# Patient Record
Sex: Female | Born: 1940 | Race: White | Hispanic: No | Marital: Married | Smoking: Never smoker
Health system: Southern US, Community
[De-identification: ages and names within clinical notes are randomized; demographics above are authoritative.]

## PROBLEM LIST (undated history)

## (undated) DIAGNOSIS — K219 Gastro-esophageal reflux disease without esophagitis: Secondary | ICD-10-CM

## (undated) DIAGNOSIS — M1990 Unspecified osteoarthritis, unspecified site: Secondary | ICD-10-CM

## (undated) DIAGNOSIS — IMO0002 Reserved for concepts with insufficient information to code with codable children: Secondary | ICD-10-CM

## (undated) DIAGNOSIS — I10 Essential (primary) hypertension: Secondary | ICD-10-CM

## (undated) HISTORY — PX: TONSILLECTOMY: SUR1361

## (undated) HISTORY — PX: APPENDECTOMY: SHX54

## (undated) HISTORY — PX: OTHER SURGICAL HISTORY: SHX169

## (undated) HISTORY — PX: ANKLE SURGERY: SHX546

---

## 2004-08-30 ENCOUNTER — Ambulatory Visit: Payer: Self-pay | Admitting: Podiatry

## 2008-02-14 ENCOUNTER — Ambulatory Visit: Payer: Self-pay | Admitting: Unknown Physician Specialty

## 2015-09-23 ENCOUNTER — Encounter: Payer: Self-pay | Admitting: *Deleted

## 2015-09-24 ENCOUNTER — Ambulatory Visit
Admission: RE | Admit: 2015-09-24 | Discharge: 2015-09-24 | Disposition: A | Discharge status: Home / Self Care | Payer: Medicare Other | Source: Ambulatory Visit | Attending: Unknown Physician Specialty | Admitting: Unknown Physician Specialty

## 2015-09-24 ENCOUNTER — Ambulatory Visit: Payer: Medicare Other | Admitting: Anesthesiology

## 2015-09-24 ENCOUNTER — Encounter
Admission: RE | Disposition: A | Discharge status: Home / Self Care | Payer: Self-pay | Source: Ambulatory Visit | Attending: Unknown Physician Specialty

## 2015-09-24 ENCOUNTER — Encounter: Payer: Self-pay | Admitting: Anesthesiology

## 2015-09-24 DIAGNOSIS — M1990 Unspecified osteoarthritis, unspecified site: Secondary | ICD-10-CM | POA: Diagnosis not present

## 2015-09-24 DIAGNOSIS — Z1211 Encounter for screening for malignant neoplasm of colon: Secondary | ICD-10-CM | POA: Insufficient documentation

## 2015-09-24 DIAGNOSIS — I10 Essential (primary) hypertension: Secondary | ICD-10-CM | POA: Insufficient documentation

## 2015-09-24 DIAGNOSIS — K635 Polyp of colon: Secondary | ICD-10-CM | POA: Insufficient documentation

## 2015-09-24 DIAGNOSIS — Z79899 Other long term (current) drug therapy: Secondary | ICD-10-CM | POA: Insufficient documentation

## 2015-09-24 DIAGNOSIS — K640 First degree hemorrhoids: Secondary | ICD-10-CM | POA: Insufficient documentation

## 2015-09-24 DIAGNOSIS — Z86010 Personal history of colonic polyps: Secondary | ICD-10-CM | POA: Insufficient documentation

## 2015-09-24 HISTORY — PX: COLONOSCOPY WITH PROPOFOL: SHX5780

## 2015-09-24 HISTORY — DX: Essential (primary) hypertension: I10

## 2015-09-24 HISTORY — DX: Unspecified osteoarthritis, unspecified site: M19.90

## 2015-09-24 SURGERY — COLONOSCOPY WITH PROPOFOL
Anesthesia: General

## 2015-09-24 MED ORDER — SODIUM CHLORIDE 0.9 % IV SOLN
INTRAVENOUS | Status: DC
  Administered 2015-09-24: 1000 mL via INTRAVENOUS

## 2015-09-24 MED ORDER — FENTANYL CITRATE (PF) 100 MCG/2ML IJ SOLN
INTRAMUSCULAR | Status: DC | PRN
  Administered 2015-09-24: 50 ug via INTRAVENOUS

## 2015-09-24 MED ORDER — PROPOFOL 500 MG/50ML IV EMUL
INTRAVENOUS | Status: DC | PRN
  Administered 2015-09-24: 100 ug/kg/min via INTRAVENOUS

## 2015-09-24 MED ORDER — MIDAZOLAM HCL 2 MG/2ML IJ SOLN
INTRAMUSCULAR | Status: DC | PRN
  Administered 2015-09-24: 1 mg via INTRAVENOUS

## 2015-09-24 MED ORDER — SODIUM CHLORIDE 0.9 % IV SOLN: INTRAVENOUS | Status: DC

## 2015-09-24 NOTE — OR Nursing (Signed)
One small polyp not retrieved

## 2015-09-24 NOTE — H&P (Signed)
   Primary Care Physician:  Rafael BihariWALKER III, JOHN B, MD Primary Gastroenterologist:  Dr. Mechele CollinElliott  Pre-Procedure History & Physical: HPI:  Cindy Greene is a 75 y.o. female is here for an colonoscopy.   Past Medical History:  Diagnosis Date  . Arthritis    osteoarthritis  . Hypertension     Past Surgical History:  Procedure Laterality Date  . ANKLE SURGERY    . APPENDECTOMY    . excision ganglion cyst wrist    . left foot reconstrucion    . right foot reconstruction    . TONSILLECTOMY      Prior to Admission medications   Medication Sig Start Date End Date Taking? Authorizing Provider  Calcium Carb-Cholecalciferol (CALCIUM CARBONATE-VITAMIN D3 PO) Take by mouth.   Yes Historical Provider, MD  Cholecalciferol (VITAMIN D3) 1000 units CAPS Take by mouth.   Yes Historical Provider, MD  estradiol (CLIMARA - DOSED IN MG/24 HR) 0.05 mg/24hr patch Place 0.05 mg onto the skin once a week.   Yes Historical Provider, MD  Ginkgo Biloba 40 MG TABS Take by mouth.   Yes Historical Provider, MD  Glucos-MSM-C-Mn-Ginger-Willow (MSM GLUCOSAMINE COMPLEX PO) Take by mouth.   Yes Historical Provider, MD  hydrochlorothiazide (HYDRODIURIL) 25 MG tablet Take 25 mg by mouth daily.   Yes Historical Provider, MD  ibuprofen (ADVIL,MOTRIN) 400 MG tablet Take 400 mg by mouth every 6 (six) hours as needed.   Yes Historical Provider, MD  Omega-3 Fatty Acids (FISH OIL CONCENTRATE PO) Take by mouth.   Yes Historical Provider, MD  progesterone (PROMETRIUM) 200 MG capsule Take 200 mg by mouth daily.   Yes Historical Provider, MD  traMADol (ULTRAM) 50 MG tablet Take by mouth every 6 (six) hours as needed.   Yes Historical Provider, MD    Allergies as of 08/19/2015  . (Not on File)    History reviewed. No pertinent family history.  Social History   Social History  . Marital status: Married    Spouse name: N/A  . Number of children: N/A  . Years of education: N/A   Occupational History  . Not on file.    Social History Main Topics  . Smoking status: Never Smoker  . Smokeless tobacco: Current User  . Alcohol use Not on file  . Drug use: Unknown  . Sexual activity: Not on file   Other Topics Concern  . Not on file   Social History Narrative  . No narrative on file    Review of Systems: See HPI, otherwise negative ROS  Physical Exam: BP (!) 175/84   Pulse 86   Temp 98.9 F (37.2 C) (Tympanic)   Resp 16   Ht 5' (1.524 m)   Wt 74.8 kg (165 lb)   SpO2 95%   BMI 32.22 kg/m  General:   Alert,  pleasant and cooperative in NAD Head:  Normocephalic and atraumatic. Neck:  Supple; no masses or thyromegaly. Lungs:  Clear throughout to auscultation.    Heart:  Regular rate and rhythm. Abdomen:  Soft, nontender and nondistended. Normal bowel sounds, without guarding, and without rebound.   Neurologic:  Alert and  oriented x4;  grossly normal neurologically.  Impression/Plan: Cindy Greene is here for an colonoscopy to [facility]  Risks, benefits, limitations, and alternatives regarding  colonoscopy have been reviewed with the patient.  Questions have been answered.  All parties agreeable.   Lynnae PrudeELLIOTT, ROBERT, MD  09/24/2015, 2:26 PM

## 2015-09-24 NOTE — Anesthesia Procedure Notes (Signed)
Performed by: COOK-MARTIN, Andrian Sabala Pre-anesthesia Checklist: Patient identified, Emergency Drugs available, Suction available, Patient being monitored and Timeout performed Patient Re-evaluated:Patient Re-evaluated prior to inductionOxygen Delivery Method: Nasal cannula Preoxygenation: Pre-oxygenation with 100% oxygen Intubation Type: IV induction Placement Confirmation: positive ETCO2 and CO2 detector       

## 2015-09-24 NOTE — Transfer of Care (Signed)
Immediate Anesthesia Transfer of Care Note  Patient: Cindy JewelsBonnie L Navarrete  Procedure(s) Performed: Procedure(s): COLONOSCOPY WITH PROPOFOL (N/A)  Patient Location: PACU  Anesthesia Type:General  Level of Consciousness: awake and sedated  Airway & Oxygen Therapy: Patient Spontanous Breathing and Patient connected to nasal cannula oxygen  Post-op Assessment: Report given to RN and Post -op Vital signs reviewed and stable  Post vital signs: Reviewed and stable  Last Vitals:  Vitals:   09/24/15 1309  BP: (!) 175/84  Pulse: 86  Resp: 16  Temp: 37.2 C    Last Pain:  Vitals:   09/24/15 1309  TempSrc: Tympanic         Complications: No apparent anesthesia complications

## 2015-09-24 NOTE — Anesthesia Preprocedure Evaluation (Signed)
Anesthesia Evaluation  Patient identified by MRN, date of birth, ID band Patient awake    Reviewed: Allergy & Precautions, NPO status , Patient's Chart, lab work & pertinent test results  Airway Mallampati: III       Dental  (+) Teeth Intact   Pulmonary neg pulmonary ROS,    breath sounds clear to auscultation       Cardiovascular Exercise Tolerance: Good hypertension, Pt. on medications  Rhythm:Regular     Neuro/Psych negative neurological ROS     GI/Hepatic negative GI ROS, Neg liver ROS,   Endo/Other  negative endocrine ROS  Renal/GU negative Renal ROS     Musculoskeletal   Abdominal Normal abdominal exam  (+)   Peds negative pediatric ROS (+)  Hematology negative hematology ROS (+)   Anesthesia Other Findings   Reproductive/Obstetrics                             Anesthesia Physical Anesthesia Plan  ASA: II  Anesthesia Plan: General   Post-op Pain Management:    Induction: Intravenous  Airway Management Planned: Natural Airway and Nasal Cannula  Additional Equipment:   Intra-op Plan:   Post-operative Plan:   Informed Consent: I have reviewed the patients History and Physical, chart, labs and discussed the procedure including the risks, benefits and alternatives for the proposed anesthesia with the patient or authorized representative who has indicated his/her understanding and acceptance.     Plan Discussed with: CRNA  Anesthesia Plan Comments:         Anesthesia Quick Evaluation

## 2015-09-24 NOTE — Op Note (Signed)
[facility] Gastroenterology Patient Name: Stanford BreedBonnie Greene Procedure Date: 09/24/2015 2:34 PM MRN: [id number] Account #: [id number] Date of Birth: 10/02/1940 Admit Type: Outpatient Age: [age] [facility] Gender: Female Note Status: Finalized Procedure:            Colonoscopy Indications:          High risk colon cancer surveillance: Personal history                        of colonic polyps Providers:            Scot Junobert T. Jaedan Huttner, MD Referring MD:         Letta PateJohn B. Danne HarborWalker III, MD (Referring MD) Medicines:            Propofol per Anesthesia Complications:        No immediate complications. Procedure:            Pre-Anesthesia Assessment:                       - After reviewing the risks and benefits, the patient                        was deemed in satisfactory condition to undergo the                        procedure.                       After obtaining informed consent, the colonoscope was                        passed under direct vision. Throughout the procedure,                        the patient's blood pressure, pulse, and oxygen                        saturations were monitored continuously. The                        Colonoscope was introduced through the anus and                        advanced to the the cecum, identified by appendiceal                        orifice and ileocecal valve. The colonoscopy was                        performed without difficulty. The patient tolerated the                        procedure well. The quality of the bowel preparation                        was good. Findings:      A diminutive polyp was found in the sigmoid colon. The polyp was       sessile. The polyp was removed with a cold snare. Resection was       complete, but the polyp tissue was not retrieved.  Internal hemorrhoids were found during endoscopy. The hemorrhoids were       small and Grade I (internal hemorrhoids that do not prolapse).  The exam was otherwise without abnormality. Impression:           - One diminutive polyp in the sigmoid colon, removed                        with a cold snare. Complete resection. Polyp tissue not                        retrieved.                       - Internal hemorrhoids.                       - The examination was otherwise normal. Recommendation:       - The findings and recommendations were discussed with                        the patient's family. Repeat in 5 years or not at all. Scot Jun, MD 09/24/2015 2:56:58 PM This report has been signed electronically. Number of Addenda: 0 Note Initiated On: 09/24/2015 2:34 PM Scope Withdrawal Time: 0 hours 10 minutes 36 seconds  Total Procedure Duration: 0 hours 16 minutes 19 seconds       [facility]

## 2015-09-25 NOTE — Anesthesia Postprocedure Evaluation (Signed)
Anesthesia Post Note  Patient: Cindy Greene  Procedure(s) Performed: Procedure(s) (LRB): COLONOSCOPY WITH PROPOFOL (N/A)  Patient location during evaluation: PACU Anesthesia Type: General Level of consciousness: awake Pain management: pain level controlled Respiratory status: spontaneous breathing Cardiovascular status: stable Anesthetic complications: no    Last Vitals:  Vitals:   09/24/15 1517 09/24/15 1527  BP: (!) 148/72 (!) 160/69  Pulse: 66 63  Resp: 16 14  Temp:      Last Pain:  Vitals:   09/25/15 0740  TempSrc:   PainSc: 0-No pain                 VAN STAVEREN,Marvel Mcphillips

## 2016-02-22 ENCOUNTER — Other Ambulatory Visit: Payer: Self-pay | Admitting: Orthopedic Surgery

## 2016-02-22 DIAGNOSIS — M2391 Unspecified internal derangement of right knee: Secondary | ICD-10-CM

## 2016-02-22 DIAGNOSIS — M1711 Unilateral primary osteoarthritis, right knee: Secondary | ICD-10-CM

## 2016-03-04 ENCOUNTER — Ambulatory Visit
Admission: RE | Admit: 2016-03-04 | Discharge: 2016-03-04 | Disposition: A | Discharge status: Home / Self Care | Payer: Medicare Other | Source: Ambulatory Visit | Attending: Orthopedic Surgery | Admitting: Orthopedic Surgery

## 2016-03-04 DIAGNOSIS — M1711 Unilateral primary osteoarthritis, right knee: Secondary | ICD-10-CM | POA: Insufficient documentation

## 2016-03-04 DIAGNOSIS — X58XXXA Exposure to other specified factors, initial encounter: Secondary | ICD-10-CM | POA: Insufficient documentation

## 2016-03-04 DIAGNOSIS — M2391 Unspecified internal derangement of right knee: Secondary | ICD-10-CM

## 2016-03-04 DIAGNOSIS — M2241 Chondromalacia patellae, right knee: Secondary | ICD-10-CM | POA: Insufficient documentation

## 2016-03-04 DIAGNOSIS — M25461 Effusion, right knee: Secondary | ICD-10-CM | POA: Diagnosis not present

## 2016-03-04 DIAGNOSIS — S83231A Complex tear of medial meniscus, current injury, right knee, initial encounter: Secondary | ICD-10-CM | POA: Insufficient documentation

## 2016-04-06 ENCOUNTER — Encounter: Payer: Self-pay | Admitting: *Deleted

## 2016-04-07 ENCOUNTER — Encounter: Payer: Self-pay | Admitting: Anesthesiology

## 2016-04-07 NOTE — Discharge Instructions (Signed)

## 2016-04-13 ENCOUNTER — Ambulatory Visit: Admit: 2016-04-13 | Payer: Medicare Other | Admitting: Surgery

## 2016-04-13 HISTORY — DX: Reserved for concepts with insufficient information to code with codable children: IMO0002

## 2016-04-13 SURGERY — ARTHROSCOPY, KNEE
Anesthesia: Choice | Laterality: Right

## 2016-04-15 ENCOUNTER — Encounter: Payer: Self-pay | Admitting: *Deleted

## 2016-04-20 ENCOUNTER — Ambulatory Visit: Payer: Medicare Other | Admitting: Anesthesiology

## 2016-04-20 ENCOUNTER — Encounter
Admission: RE | Disposition: A | Discharge status: Home / Self Care | Payer: Self-pay | Source: Ambulatory Visit | Attending: Surgery

## 2016-04-20 ENCOUNTER — Ambulatory Visit
Admission: RE | Admit: 2016-04-20 | Discharge: 2016-04-20 | Disposition: A | Discharge status: Home / Self Care | Payer: Medicare Other | Source: Ambulatory Visit | Attending: Surgery | Admitting: Surgery

## 2016-04-20 DIAGNOSIS — Z79899 Other long term (current) drug therapy: Secondary | ICD-10-CM | POA: Diagnosis not present

## 2016-04-20 DIAGNOSIS — M159 Polyosteoarthritis, unspecified: Secondary | ICD-10-CM | POA: Diagnosis not present

## 2016-04-20 DIAGNOSIS — S83231A Complex tear of medial meniscus, current injury, right knee, initial encounter: Secondary | ICD-10-CM | POA: Diagnosis not present

## 2016-04-20 DIAGNOSIS — Z791 Long term (current) use of non-steroidal anti-inflammatories (NSAID): Secondary | ICD-10-CM | POA: Diagnosis not present

## 2016-04-20 DIAGNOSIS — X58XXXA Exposure to other specified factors, initial encounter: Secondary | ICD-10-CM | POA: Insufficient documentation

## 2016-04-20 DIAGNOSIS — I10 Essential (primary) hypertension: Secondary | ICD-10-CM | POA: Insufficient documentation

## 2016-04-20 HISTORY — PX: KNEE ARTHROSCOPY WITH MENISCAL REPAIR: SHX5653

## 2016-04-20 SURGERY — ARTHROSCOPY, KNEE, WITH MENISCUS REPAIR
Anesthesia: General | Site: Knee | Laterality: Right | Wound class: Clean

## 2016-04-20 MED ORDER — FENTANYL CITRATE (PF) 100 MCG/2ML IJ SOLN
25.0000 ug | INTRAMUSCULAR | Status: DC | PRN
  Administered 2016-04-20: 50 ug via INTRAVENOUS

## 2016-04-20 MED ORDER — METOCLOPRAMIDE HCL 5 MG/ML IJ SOLN: 5.0000 mg | Freq: Three times a day (TID) | INTRAMUSCULAR | Status: DC | PRN

## 2016-04-20 MED ORDER — ONDANSETRON HCL 4 MG PO TABS: 4.0000 mg | ORAL_TABLET | Freq: Four times a day (QID) | ORAL | Status: DC | PRN

## 2016-04-20 MED ORDER — POTASSIUM CHLORIDE IN NACL 20-0.9 MEQ/L-% IV SOLN: INTRAVENOUS | Status: DC

## 2016-04-20 MED ORDER — ONDANSETRON HCL 4 MG/2ML IJ SOLN: 4.0000 mg | Freq: Four times a day (QID) | INTRAMUSCULAR | Status: DC | PRN

## 2016-04-20 MED ORDER — DEXAMETHASONE SODIUM PHOSPHATE 4 MG/ML IJ SOLN
INTRAMUSCULAR | Status: DC | PRN
  Administered 2016-04-20: 4 mg via INTRAVENOUS

## 2016-04-20 MED ORDER — LIDOCAINE HCL (CARDIAC) 20 MG/ML IV SOLN
INTRAVENOUS | Status: DC | PRN
  Administered 2016-04-20: 80 mg via INTRATRACHEAL

## 2016-04-20 MED ORDER — PROPOFOL 10 MG/ML IV BOLUS
INTRAVENOUS | Status: DC | PRN
  Administered 2016-04-20: 150 mg via INTRAVENOUS

## 2016-04-20 MED ORDER — OXYCODONE HCL 5 MG/5ML PO SOLN: 5.0000 mg | Freq: Once | ORAL | Status: AC | PRN

## 2016-04-20 MED ORDER — ONDANSETRON HCL 4 MG/2ML IJ SOLN
INTRAMUSCULAR | Status: DC | PRN
  Administered 2016-04-20: 4 mg via INTRAVENOUS

## 2016-04-20 MED ORDER — METOCLOPRAMIDE HCL 5 MG PO TABS: 5.0000 mg | ORAL_TABLET | Freq: Three times a day (TID) | ORAL | Status: DC | PRN

## 2016-04-20 MED ORDER — LIDOCAINE HCL 1 % IJ SOLN
INTRAMUSCULAR | Status: DC | PRN
  Administered 2016-04-20: 60 mL via INTRAMUSCULAR

## 2016-04-20 MED ORDER — HYDROCODONE-ACETAMINOPHEN 5-325 MG PO TABS: 1.0000 | ORAL_TABLET | Freq: Four times a day (QID) | ORAL | 0 refills | Status: DC | PRN

## 2016-04-20 MED ORDER — BUPIVACAINE-EPINEPHRINE (PF) 0.5% -1:200000 IJ SOLN
INTRAMUSCULAR | Status: DC | PRN
  Administered 2016-04-20: 20 mL

## 2016-04-20 MED ORDER — FENTANYL CITRATE (PF) 100 MCG/2ML IJ SOLN
INTRAMUSCULAR | Status: DC | PRN
  Administered 2016-04-20 (×2): 25 ug via INTRAVENOUS

## 2016-04-20 MED ORDER — OXYCODONE HCL 5 MG PO TABS
5.0000 mg | ORAL_TABLET | Freq: Once | ORAL | Status: AC | PRN
  Administered 2016-04-20: 5 mg via ORAL

## 2016-04-20 MED ORDER — HYDROCODONE-ACETAMINOPHEN 5-325 MG PO TABS: 1.0000 | ORAL_TABLET | ORAL | Status: DC | PRN

## 2016-04-20 MED ORDER — CEFAZOLIN SODIUM-DEXTROSE 2-4 GM/100ML-% IV SOLN
2.0000 g | Freq: Once | INTRAVENOUS | Status: AC
  Administered 2016-04-20: 2 g via INTRAVENOUS

## 2016-04-20 MED ORDER — MIDAZOLAM HCL 5 MG/5ML IJ SOLN
INTRAMUSCULAR | Status: DC | PRN
  Administered 2016-04-20: 2 mg via INTRAVENOUS

## 2016-04-20 MED ORDER — LACTATED RINGERS IV SOLN
INTRAVENOUS | Status: DC | PRN
  Administered 2016-04-20: 13:00:00 via INTRAVENOUS

## 2016-04-20 SURGICAL SUPPLY — 35 items
BANDAGE ELASTIC 6 LF NS (GAUZE/BANDAGES/DRESSINGS) ×3 IMPLANT
BLADE FULL RADIUS 3.5 (BLADE) ×3 IMPLANT
BUR ACROMIONIZER 4.0 (BURR) IMPLANT
CHLORAPREP W/TINT 26ML (MISCELLANEOUS) ×3 IMPLANT
COVER LIGHT HANDLE UNIVERSAL (MISCELLANEOUS) ×6 IMPLANT
CUFF TOURN SGL QUICK 24 (TOURNIQUET CUFF) ×1
CUFF TOURN SGL QUICK 30 (MISCELLANEOUS)
CUFF TOURN SGL QUICK 34 (TOURNIQUET CUFF)
CUFF TRNQT CYL 24X4X40X1 (TOURNIQUET CUFF) ×2 IMPLANT
CUFF TRNQT CYL 34X4X40X1 (TOURNIQUET CUFF) IMPLANT
CUFF TRNQT CYL LO 30X4X (MISCELLANEOUS) IMPLANT
DRAPE IMP U-DRAPE 54X76 (DRAPES) ×3 IMPLANT
FASTFIX NDL DEL SYS 360 CVD (Miscellaneous) ×6 IMPLANT
GAUZE SPONGE 4X4 12PLY STRL (GAUZE/BANDAGES/DRESSINGS) ×3 IMPLANT
GLOVE BIO SURGEON STRL SZ8 (GLOVE) ×6 IMPLANT
GLOVE INDICATOR 8.0 STRL GRN (GLOVE) ×6 IMPLANT
GOWN STRL REUS W/ TWL LRG LVL3 (GOWN DISPOSABLE) ×2 IMPLANT
GOWN STRL REUS W/ TWL XL LVL3 (GOWN DISPOSABLE) ×2 IMPLANT
GOWN STRL REUS W/TWL LRG LVL3 (GOWN DISPOSABLE) ×1
GOWN STRL REUS W/TWL XL LVL3 (GOWN DISPOSABLE) ×1
IV LACTATED RINGER IRRG 3000ML (IV SOLUTION) ×1
IV LR IRRIG 3000ML ARTHROMATIC (IV SOLUTION) ×2 IMPLANT
KIT ROOM TURNOVER OR (KITS) ×3 IMPLANT
MANIFOLD 4PT FOR NEPTUNE1 (MISCELLANEOUS) ×3 IMPLANT
NEEDLE HYPO 21X1.5 SAFETY (NEEDLE) ×6 IMPLANT
PACK ARTHROSCOPY KNEE (MISCELLANEOUS) ×3 IMPLANT
PUSHER KNOT ARTHRO STRT FASTFI (MISCELLANEOUS) ×3 IMPLANT
STRAP BODY AND KNEE 60X3 (MISCELLANEOUS) ×3 IMPLANT
SUT PROLENE 4 0 PS 2 18 (SUTURE) ×3 IMPLANT
SUT VIC AB 2-0 CT1 27 (SUTURE)
SUT VIC AB 2-0 CT1 TAPERPNT 27 (SUTURE) IMPLANT
SYR 50ML LL SCALE MARK (SYRINGE) ×3 IMPLANT
SYSTEM NDL DEL FSTFX  360 CVD (Miscellaneous) ×4 IMPLANT
TUBING ARTHRO INFLOW-ONLY STRL (TUBING) ×3 IMPLANT
WAND HAND CNTRL MULTIVAC 90 (MISCELLANEOUS) IMPLANT

## 2016-04-20 NOTE — H&P (Signed)
Paper H&P to be scanned into permanent record. H&P reviewed and patient re-examined. No changes. 

## 2016-04-20 NOTE — Discharge Instructions (Signed)
General Anesthesia, Adult, Care After These instructions provide you with information about caring for yourself after your procedure. Your health care provider may also give you more specific instructions. Your treatment has been planned according to current medical practices, but problems sometimes occur. Call your health care provider if you have any problems or questions after your procedure. What can I expect after the procedure? After the procedure, it is common to have:  Vomiting.  A sore throat.  Mental slowness. It is common to feel:  Nauseous.  Cold or shivery.  Sleepy.  Tired.  Sore or achy, even in parts of your body where you did not have surgery. Follow these instructions at home: For at least 24 hours after the procedure:   Do not:  Participate in activities where you could fall or become injured.  Drive.  Use heavy machinery.  Drink alcohol.  Take sleeping pills or medicines that cause drowsiness.  Make important decisions or sign legal documents.  Take care of children on your own.  Rest. Eating and drinking   If you vomit, drink water, juice, or soup when you can drink without vomiting.  Drink enough fluid to keep your urine clear or pale yellow.  Make sure you have little or no nausea before eating solid foods.  Follow the diet recommended by your health care provider. General instructions   Have a responsible adult stay with you until you are awake and alert.  Return to your normal activities as told by your health care provider. Ask your health care provider what activities are safe for you.  Take over-the-counter and prescription medicines only as told by your health care provider.  If you smoke, do not smoke without supervision.  Keep all follow-up visits as told by your health care provider. This is important. Contact a health care provider if:  You continue to have nausea or vomiting at home, and medicines are not helpful.  You  cannot drink fluids or start eating again.  You cannot urinate after 8-12 hours.  You develop a skin rash.  You have fever.  You have increasing redness at the site of your procedure. Get help right away if:  You have difficulty breathing.  You have chest pain.  You have unexpected bleeding.  You feel that you are having a life-threatening or urgent problem. This information is not intended to replace advice given to you by your health care provider. Make sure you discuss any questions you have with your health care provider. Document Released: 04/04/2000 Document Revised: 06/01/2015 Document Reviewed: 12/11/2014 Elsevier Interactive Patient Education  2017 [employer].  Keep dressing dry and intact.  May shower after dressing changed on post-op day #4 (Sunday).  Cover sutures with Band-Aids after drying off. Apply ice frequently to knee. Take ibuprofen 600-800 mg TID with meals for 7-10 days, then as necessary. Take pain medication as prescribed or ES Tylenol when needed.  May weight-bear as tolerated - use crutches or walker as needed. Follow-up in 10-14 days or as scheduled.

## 2016-04-20 NOTE — Anesthesia Postprocedure Evaluation (Addendum)
Anesthesia Post Note  Patient: Cindy Greene  Procedure(s) Performed: Procedure(s) (LRB): KNEE ARTHROSCOPY WITH MENISCAL REPAIR (Right)  Patient location during evaluation: PACU Anesthesia Type: General Level of consciousness: awake Pain management: pain level controlled Vital Signs Assessment: post-procedure vital signs reviewed and stable Respiratory status: spontaneous breathing Cardiovascular status: blood pressure returned to baseline Postop Assessment: no headache Anesthetic complications: no    Verner Chol, III,  Soniyah Mcglory D

## 2016-04-20 NOTE — Anesthesia Procedure Notes (Signed)
Procedure Name: LMA Insertion Date/Time: 04/20/2016 3:01 PM Performed by: Maryan Rued Pre-anesthesia Checklist: Patient identified, Emergency Drugs available, Suction available and Patient being monitored Patient Re-evaluated:Patient Re-evaluated prior to inductionOxygen Delivery Method: Circle system utilized Preoxygenation: Pre-oxygenation with 100% oxygen Intubation Type: IV induction Ventilation: Mask ventilation without difficulty LMA Size: 4.0 Number of attempts: 1 Tube secured with: Tape Comments: Scratch to upper lip ointment applied

## 2016-04-20 NOTE — Anesthesia Preprocedure Evaluation (Signed)
Anesthesia Evaluation  Patient identified by MRN, date of birth, ID band Patient awake    Reviewed: Allergy & Precautions  Airway Mallampati: II  TM Distance: >3 FB Neck ROM: full    Dental no notable dental hx.    Pulmonary neg pulmonary ROS,    Pulmonary exam normal        Cardiovascular hypertension, On Medications Normal cardiovascular exam     Neuro/Psych negative neurological ROS     GI/Hepatic negative GI ROS, Neg liver ROS,   Endo/Other  negative endocrine ROS  Renal/GU negative Renal ROS  negative genitourinary   Musculoskeletal   Abdominal   Peds  Hematology negative hematology ROS (+)   Anesthesia Other Findings   Reproductive/Obstetrics                             Anesthesia Physical Anesthesia Plan  ASA: II  Anesthesia Plan: General LMA   Post-op Pain Management:    Induction:   Airway Management Planned:   Additional Equipment:   Intra-op Plan:   Post-operative Plan:   Informed Consent: I have reviewed the patients History and Physical, chart, labs and discussed the procedure including the risks, benefits and alternatives for the proposed anesthesia with the patient or authorized representative who has indicated his/her understanding and acceptance.     Plan Discussed with:   Anesthesia Plan Comments:         Anesthesia Quick Evaluation

## 2016-04-20 NOTE — Transfer of Care (Signed)
Immediate Anesthesia Transfer of Care Note  Patient: Cindy Greene  Procedure(s) Performed: Procedure(s): KNEE ARTHROSCOPY WITH MENISCAL REPAIR (Right)  Patient Location: PACU  Anesthesia Type: General LMA  Level of Consciousness: awake, alert  and patient cooperative  Airway and Oxygen Therapy: Patient Spontanous Breathing and Patient connected to supplemental oxygen  Post-op Assessment: Post-op Vital signs reviewed, Patient's Cardiovascular Status Stable, Respiratory Function Stable, Patent Airway and No signs of Nausea or vomiting  Post-op Vital Signs: Reviewed and stable  Complications: No apparent anesthesia complications

## 2016-04-20 NOTE — Op Note (Signed)
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  Postoperative diagnosis:   Same.  Procedure:   Arthroscopic debridement with medial meniscus repair, right knee.  Surgeon:   Maryagnes Amos, M.D.  Anesthesia:   General LMA.  Findings:   As above. There was a complex tear of the posterior most portion of the medial meniscus. The lateral meniscus was in satisfactory condition as were the anterior posterior cruciate ligaments. There were grade 2-3 chondromalacial changes involving the femoral trochlea. The remaining articular surfaces all were in satisfactory condition.  Complications:   None.  EBL:   5 cc.  Total fluids:   900 cc of crystalloid.  Tourniquet time:   None  Drains:   None  Closure:   4-0 Prolene interrupted sutures.  Brief clinical note:   The patient is a 76 year old female with a history of medial sided right knee pain. Her symptoms have persisted despite medications, activity modification, etc. Her history and examination are consistent with a medial meniscus tear confirmed by MRI scan.. The patient presents at this time for arthroscopy, debridement, and repair versus partial medial meniscectomy.  Procedure:   The patient was brought into the operating room and lain in the supine position. After adequate general laryngeal mask anesthesia was obtained, a timeout was performed to verify the appropriate side. The patient's right knee was injected sterilely using a solution of 30 cc of 1% lidocaine and 30 cc of 0.5% Sensorcaine with epinephrine. The right lower extremity was prepped with ChloraPrep solution before being draped sterilely. Preoperative antibiotics were administered. The expected portal sites were injected with 0.5% Sensorcaine with epinephrine before the camera was placed in the anterolateral portal and instrumentation performed through the  anteromedial portal. The knee was sequentially examined beginning in the suprapatellar pouch, then progressing to the patellofemoral space, the medial gutter compartment, the notch, and finally the lateral compartment and gutter. The findings were as described above. Abundant reactive synovial tissues anteriorly were debrided using the full-radius resector in order to improve visualization. The areas of loose/unstable portions of the complex medial meniscus tear was debrided back to stable margins. The remaining radial portion of the tear was repaired using two [employer] FasT-Fix 360 devices. Subsequent probing of the repair demonstrated good stability. Laterally, the meniscus was stable to probing, as were the anterior and posterior cruciate ligaments. The instruments were removed from the joint after suctioning the excess fluid. The portal sites were closed using 4-0 Prolene interrupted sutures before a sterile bulky dressing was applied to the knee. The patient was then awakened, extubated, and returned to the recovery room in satisfactory condition after tolerating the procedure well.

## 2016-04-21 ENCOUNTER — Encounter: Payer: Self-pay | Admitting: Surgery

## 2018-10-18 ENCOUNTER — Other Ambulatory Visit: Payer: Self-pay | Admitting: Orthopedic Surgery

## 2018-10-18 DIAGNOSIS — M84362A Stress fracture, left tibia, initial encounter for fracture: Secondary | ICD-10-CM

## 2018-10-18 DIAGNOSIS — M7052 Other bursitis of knee, left knee: Secondary | ICD-10-CM

## 2018-10-18 DIAGNOSIS — M1712 Unilateral primary osteoarthritis, left knee: Secondary | ICD-10-CM

## 2018-10-22 ENCOUNTER — Ambulatory Visit
Admission: RE | Admit: 2018-10-22 | Discharge: 2018-10-22 | Disposition: A | Discharge status: Home / Self Care | Payer: Medicare Other | Source: Ambulatory Visit | Attending: Orthopedic Surgery | Admitting: Orthopedic Surgery

## 2018-10-22 ENCOUNTER — Other Ambulatory Visit: Payer: Self-pay

## 2018-10-22 DIAGNOSIS — M84362A Stress fracture, left tibia, initial encounter for fracture: Secondary | ICD-10-CM | POA: Diagnosis present

## 2018-10-22 DIAGNOSIS — M1712 Unilateral primary osteoarthritis, left knee: Secondary | ICD-10-CM | POA: Diagnosis present

## 2018-10-22 DIAGNOSIS — M7052 Other bursitis of knee, left knee: Secondary | ICD-10-CM | POA: Diagnosis present

## 2020-05-29 ENCOUNTER — Emergency Department: Payer: Medicare Other

## 2020-05-29 ENCOUNTER — Other Ambulatory Visit: Payer: Self-pay

## 2020-05-29 ENCOUNTER — Encounter: Payer: Self-pay | Admitting: Emergency Medicine

## 2020-05-29 ENCOUNTER — Observation Stay
Admit: 2020-05-29 | Discharge: 2020-05-29 | Disposition: A | Discharge status: Home / Self Care | Payer: Medicare Other | Attending: Internal Medicine | Admitting: Internal Medicine

## 2020-05-29 ENCOUNTER — Observation Stay: Payer: Medicare Other

## 2020-05-29 ENCOUNTER — Observation Stay
Admission: EM | Admit: 2020-05-29 | Discharge: 2020-05-30 | Disposition: A | Discharge status: Home / Self Care | Payer: Medicare Other | Attending: Internal Medicine | Admitting: Internal Medicine

## 2020-05-29 DIAGNOSIS — R079 Chest pain, unspecified: Secondary | ICD-10-CM

## 2020-05-29 DIAGNOSIS — Z20822 Contact with and (suspected) exposure to covid-19: Secondary | ICD-10-CM | POA: Diagnosis not present

## 2020-05-29 DIAGNOSIS — R0789 Other chest pain: Secondary | ICD-10-CM | POA: Diagnosis present

## 2020-05-29 DIAGNOSIS — Z79899 Other long term (current) drug therapy: Secondary | ICD-10-CM | POA: Diagnosis not present

## 2020-05-29 DIAGNOSIS — R29898 Other symptoms and signs involving the musculoskeletal system: Secondary | ICD-10-CM | POA: Diagnosis present

## 2020-05-29 DIAGNOSIS — I160 Hypertensive urgency: Secondary | ICD-10-CM | POA: Diagnosis not present

## 2020-05-29 DIAGNOSIS — I10 Essential (primary) hypertension: Secondary | ICD-10-CM | POA: Insufficient documentation

## 2020-05-29 DIAGNOSIS — K219 Gastro-esophageal reflux disease without esophagitis: Secondary | ICD-10-CM | POA: Diagnosis present

## 2020-05-29 DIAGNOSIS — R42 Dizziness and giddiness: Secondary | ICD-10-CM

## 2020-05-29 HISTORY — DX: Gastro-esophageal reflux disease without esophagitis: K21.9

## 2020-05-29 LAB — BASIC METABOLIC PANEL
Anion gap: 9 (ref 5–15)
BUN: 17 mg/dL (ref 8–23)
CO2: 25 mmol/L (ref 22–32)
Calcium: 8.8 mg/dL — ABNORMAL LOW (ref 8.9–10.3)
Chloride: 103 mmol/L (ref 98–111)
Creatinine, Ser: 0.72 mg/dL (ref 0.44–1.00)
GFR, Estimated: >60 mL/min (ref >60)
Glucose, Bld: 130 mg/dL — ABNORMAL HIGH (ref 70–99)
Potassium: 3.7 mmol/L (ref 3.5–5.1)
Sodium: 137 mmol/L (ref 135–145)

## 2020-05-29 LAB — TROPONIN I (HIGH SENSITIVITY)
Troponin I (High Sensitivity): 16 ng/L (ref <18)
Troponin I (High Sensitivity): 16 ng/L (ref <18)
Troponin I (High Sensitivity): 18 ng/L — ABNORMAL HIGH (ref <18)
Troponin I (High Sensitivity): 21 ng/L — ABNORMAL HIGH (ref <18)

## 2020-05-29 LAB — CBC
HCT: 47.5 % — ABNORMAL HIGH (ref 36.0–46.0)
Hemoglobin: 16.1 g/dL — ABNORMAL HIGH (ref 12.0–15.0)
MCH: 30.4 pg (ref 26.0–34.0)
MCHC: 33.9 g/dL (ref 30.0–36.0)
MCV: 89.8 fL (ref 80.0–100.0)
Platelets: 326 10*3/uL (ref 150–400)
RBC: 5.29 MIL/uL — ABNORMAL HIGH (ref 3.87–5.11)
RDW: 13.7 % (ref 11.5–15.5)
WBC: 8.2 10*3/uL (ref 4.0–10.5)
nRBC: 0 % (ref 0.0–0.2)

## 2020-05-29 LAB — SARS CORONAVIRUS 2 (TAT 6-24 HRS): SARS Coronavirus 2: NEGATIVE

## 2020-05-29 MED ORDER — ATORVASTATIN CALCIUM 20 MG PO TABS
40.0000 mg | ORAL_TABLET | Freq: Every day | ORAL | Status: DC
  Administered 2020-05-29 – 2020-05-30 (×2): 40 mg via ORAL
  Filled 2020-05-29 (×2): qty 2

## 2020-05-29 MED ORDER — GINKGO BILOBA 40 MG PO TABS: 1.0000 | ORAL_TABLET | Freq: Every day | ORAL | Status: DC

## 2020-05-29 MED ORDER — ACETAMINOPHEN 325 MG PO TABS
650.0000 mg | ORAL_TABLET | Freq: Four times a day (QID) | ORAL | Status: DC | PRN
  Administered 2020-05-29: 650 mg via ORAL
  Filled 2020-05-29: qty 2

## 2020-05-29 MED ORDER — ONDANSETRON HCL 4 MG/2ML IJ SOLN: 4.0000 mg | Freq: Three times a day (TID) | INTRAMUSCULAR | Status: DC | PRN

## 2020-05-29 MED ORDER — TRAMADOL HCL 50 MG PO TABS
50.0000 mg | ORAL_TABLET | Freq: Three times a day (TID) | ORAL | Status: DC | PRN
  Administered 2020-05-29: 50 mg via ORAL
  Filled 2020-05-29: qty 1

## 2020-05-29 MED ORDER — VITAMIN D3 25 MCG (1000 UNIT) PO TABS
1000.0000 [IU] | ORAL_TABLET | Freq: Every day | ORAL | Status: DC
  Administered 2020-05-30: 1000 [IU] via ORAL
  Filled 2020-05-29 (×2): qty 1

## 2020-05-29 MED ORDER — LOSARTAN POTASSIUM 25 MG PO TABS
25.0000 mg | ORAL_TABLET | Freq: Every day | ORAL | Status: DC
  Administered 2020-05-30: 25 mg via ORAL
  Filled 2020-05-29 (×2): qty 1

## 2020-05-29 MED ORDER — IOHEXOL 350 MG/ML SOLN
75.0000 mL | Freq: Once | INTRAVENOUS | Status: AC | PRN
  Administered 2020-05-29: 75 mL via INTRAVENOUS

## 2020-05-29 MED ORDER — HEPARIN BOLUS VIA INFUSION
3500.0000 [IU] | Freq: Once | INTRAVENOUS | Status: AC
  Administered 2020-05-29: 3500 [IU] via INTRAVENOUS
  Filled 2020-05-29: qty 3500

## 2020-05-29 MED ORDER — NITROGLYCERIN 0.4 MG SL SUBL: 0.4000 mg | SUBLINGUAL_TABLET | SUBLINGUAL | Status: DC | PRN

## 2020-05-29 MED ORDER — ENOXAPARIN SODIUM 40 MG/0.4ML IJ SOSY: 40.0000 mg | PREFILLED_SYRINGE | INTRAMUSCULAR | Status: DC

## 2020-05-29 MED ORDER — HYDROCHLOROTHIAZIDE 25 MG PO TABS
25.0000 mg | ORAL_TABLET | Freq: Every day | ORAL | Status: DC
  Administered 2020-05-29 – 2020-05-30 (×2): 25 mg via ORAL
  Filled 2020-05-29 (×2): qty 1

## 2020-05-29 MED ORDER — ASPIRIN EC 81 MG PO TBEC
81.0000 mg | DELAYED_RELEASE_TABLET | Freq: Every day | ORAL | Status: DC
  Administered 2020-05-30: 81 mg via ORAL
  Filled 2020-05-29: qty 1

## 2020-05-29 MED ORDER — HYDRALAZINE HCL 20 MG/ML IJ SOLN: 5.0000 mg | INTRAMUSCULAR | Status: DC | PRN

## 2020-05-29 MED ORDER — LABETALOL HCL 5 MG/ML IV SOLN
10.0000 mg | Freq: Once | INTRAVENOUS | Status: AC
  Administered 2020-05-29: 10 mg via INTRAVENOUS
  Filled 2020-05-29: qty 4

## 2020-05-29 MED ORDER — MORPHINE SULFATE (PF) 2 MG/ML IV SOLN: 1.0000 mg | INTRAVENOUS | Status: DC | PRN

## 2020-05-29 MED ORDER — MIDAZOLAM HCL 2 MG/2ML IJ SOLN
0.5000 mg | Freq: Once | INTRAMUSCULAR | Status: AC
  Administered 2020-05-29: 0.5 mg via INTRAVENOUS
  Filled 2020-05-29: qty 2

## 2020-05-29 MED ORDER — OYSTER SHELL CALCIUM/D 500-5 MG-MCG PO TABS
1.0000 | ORAL_TABLET | Freq: Every day | ORAL | Status: DC
  Administered 2020-05-30: 1 via ORAL
  Filled 2020-05-29 (×3): qty 1

## 2020-05-29 MED ORDER — OMEGA-3-ACID ETHYL ESTERS 1 G PO CAPS
1.0000 g | ORAL_CAPSULE | Freq: Every day | ORAL | Status: DC
  Administered 2020-05-30: 1 g via ORAL
  Filled 2020-05-29: qty 1

## 2020-05-29 MED ORDER — LORATADINE 10 MG PO TABS
10.0000 mg | ORAL_TABLET | Freq: Every day | ORAL | Status: DC
  Administered 2020-05-30: 10 mg via ORAL
  Filled 2020-05-29 (×2): qty 1

## 2020-05-29 MED ORDER — ASPIRIN 81 MG PO CHEW
324.0000 mg | CHEWABLE_TABLET | Freq: Once | ORAL | Status: AC
  Administered 2020-05-29: 324 mg via ORAL
  Filled 2020-05-29: qty 4

## 2020-05-29 MED ORDER — HEPARIN (PORCINE) 25000 UT/250ML-% IV SOLN
950.0000 [IU]/h | INTRAVENOUS | Status: DC
  Administered 2020-05-29: 800 [IU]/h via INTRAVENOUS
  Filled 2020-05-29: qty 250

## 2020-05-29 MED ORDER — PROGESTERONE MICRONIZED 200 MG PO CAPS
200.0000 mg | ORAL_CAPSULE | Freq: Every day | ORAL | Status: DC
  Administered 2020-05-29: 200 mg via ORAL
  Filled 2020-05-29 (×3): qty 1

## 2020-05-29 MED ORDER — MECLIZINE HCL 25 MG PO TABS
25.0000 mg | ORAL_TABLET | ORAL | Status: DC | PRN
  Filled 2020-05-29: qty 1

## 2020-05-29 MED ORDER — PANTOPRAZOLE SODIUM 40 MG PO TBEC
40.0000 mg | DELAYED_RELEASE_TABLET | Freq: Every day | ORAL | Status: DC
  Administered 2020-05-29 – 2020-05-30 (×2): 40 mg via ORAL
  Filled 2020-05-29 (×2): qty 1

## 2020-05-29 MED ORDER — METOPROLOL TARTRATE 25 MG PO TABS
25.0000 mg | ORAL_TABLET | Freq: Two times a day (BID) | ORAL | Status: DC
  Administered 2020-05-30: 25 mg via ORAL
  Filled 2020-05-29 (×3): qty 1

## 2020-05-29 MED ORDER — AMLODIPINE BESYLATE 5 MG PO TABS
5.0000 mg | ORAL_TABLET | Freq: Every day | ORAL | Status: DC
  Administered 2020-05-29 – 2020-05-30 (×2): 5 mg via ORAL
  Filled 2020-05-29 (×2): qty 1

## 2020-05-29 NOTE — ED Provider Notes (Signed)
[facility] Emergency Department Provider Note   ____________________________________________    I have reviewed the triage vital signs and the nursing notes.   HISTORY  Chief Complaint Hypertension and Weakness     HPI Cindy Greene is a 80 y.o. female who presents with complaints of high blood pressure, chest discomfort and back pain.  She also describes cramping in her arms bilaterally.  Patient notes symptoms started this morning around 630.  She is never had this before.  No history of heart disease.  She reports that she had an episode of diaphoresis that was so bad that even her underwear were soaked.  No nausea or vomiting.  No abdominal pain has not take anything for this.  Past Medical History:  Diagnosis Date  . Arthritis    osteoarthritis  . Hypertension   . Positional vertigo     There are no problems to display for this patient.   Past Surgical History:  Procedure Laterality Date  . ANKLE SURGERY    . APPENDECTOMY    . COLONOSCOPY WITH PROPOFOL N/A 09/24/2015   Procedure: COLONOSCOPY WITH PROPOFOL;  Surgeon: Scot Jun, MD;  Location: [facility] ENDOSCOPY;  Service: Endoscopy;  Laterality: N/A;  . excision ganglion cyst wrist    . KNEE ARTHROSCOPY WITH MENISCAL REPAIR Right 04/20/2016   Procedure: KNEE ARTHROSCOPY WITH MENISCAL REPAIR;  Surgeon: Christena Flake, MD;  Location: [facility];  Service: Orthopedics;  Laterality: Right;  . left foot reconstrucion    . right foot reconstruction    . TONSILLECTOMY      Prior to Admission medications   Medication Sig Start Date End Date Taking? Authorizing Provider  acetaminophen (TYLENOL) 650 MG CR tablet Take 650 mg by mouth every 8 (eight) hours as needed for pain.    [provider]  Calcium Carb-Cholecalciferol (CALCIUM CARBONATE-VITAMIN D3 PO) Take by mouth.    [provider]  cetirizine (ZYRTEC) 10 MG tablet Take 10 mg by mouth daily.    [provider]  Cholecalciferol (VITAMIN D3) 1000 units CAPS Take by mouth.    [provider]  diphenhydramine-acetaminophen (TYLENOL PM) 25-500 MG TABS tablet Take 1 tablet by mouth at bedtime as needed.    [provider]  estradiol (CLIMARA - DOSED IN MG/24 HR) 0.05 mg/24hr patch Place 0.05 mg onto the skin once a week.    [provider]  fluticasone (FLONASE) 50 MCG/ACT nasal spray Place into both nostrils daily.    [provider]  Ginkgo Biloba 40 MG TABS Take by mouth.    [provider]  Glucos-MSM-C-Mn-Ginger-Willow (MSM GLUCOSAMINE COMPLEX PO) Take by mouth.    [provider]  hydrochlorothiazide (HYDRODIURIL) 25 MG tablet Take 25 mg by mouth daily.    [provider]  HYDROcodone-acetaminophen (NORCO) 5-325 MG tablet Take 1-2 tablets by mouth every 6 (six) hours as needed for moderate pain. MAXIMUM TOTAL ACETAMINOPHEN DOSE IS 4000 MG PER DAY 04/20/16   Poggi, Excell Seltzer, MD  meloxicam (MOBIC) 15 MG tablet Take 15 mg by mouth daily.    [provider]  Omega-3 Fatty Acids (FISH OIL CONCENTRATE PO) Take by mouth.    [provider]  progesterone (PROMETRIUM) 200 MG capsule Take 200 mg by mouth daily.    [provider]     Allergies Patient has no known allergies.  History reviewed. No pertinent family history.  Social History Social History   Tobacco Use  .  Smoking status: Never Smoker  . Smokeless tobacco: Never Used  Substance Use Topics  . Alcohol use: No    Review of Systems  Constitutional: No fever/chills Eyes: No visual changes.  ENT: No sore throat. Cardiovascular: As above Respiratory: Denies shortness of breath. Gastrointestinal: No abdominal pain.  No nausea, no vomiting.   Genitourinary: Negative for dysuria. Musculoskeletal: Back pain as above, cramping in both upper arms Skin: Negative for rash. Neurological: Negative for headaches or  weakness   ____________________________________________   PHYSICAL EXAM:  VITAL SIGNS: ED Triage Vitals  Enc Vitals Group     BP 05/29/20 0820 (!) 207/89     Pulse Rate 05/29/20 0820 78     Resp 05/29/20 0820 18     Temp 05/29/20 0820 98.1 F (36.7 C)     Temp Source 05/29/20 0820 Oral     SpO2 05/29/20 0820 98 %     Weight 05/29/20 0801 72.6 kg (160 lb)     Height 05/29/20 0801 1.524 m (5')     Head Circumference --      Peak Flow --      Pain Score 05/29/20 0801 0     Pain Loc --      Pain Edu? --      Excl. in GC? --     Constitutional: Alert and oriented.   Nose: No congestion/rhinnorhea. Mouth/Throat: Mucous membranes are moist.   Neck:  Painless ROM Cardiovascular: Normal rate, regular rhythm. Grossly normal heart sounds.  Good peripheral circulation. Respiratory: Normal respiratory effort.  No retractions. Lungs CTAB. Gastrointestinal: Soft and nontender. No distention.  No CVA tenderness.  Musculoskeletal: No lower extremity tenderness nor edema.  Warm and well perfused Neurologic:  Normal speech and language. No gross focal neurologic deficits are appreciated.  Skin:  Skin is warm, dry and intact. No rash noted. Psychiatric: Mood and affect are normal. Speech and behavior are normal.  ____________________________________________   LABS (all labs ordered are listed, but only abnormal results are displayed)  Labs Reviewed  BASIC METABOLIC PANEL - Abnormal; Notable for the following components:      Result Value   Glucose, Bld 130 (*)    Calcium 8.8 (*)    All other components within normal limits  CBC - Abnormal; Notable for the following components:   RBC 5.29 (*)    Hemoglobin 16.1 (*)    HCT 47.5 (*)    All other components within normal limits  SARS CORONAVIRUS 2 (TAT 6-24 HRS)  TROPONIN I (HIGH SENSITIVITY)   ____________________________________________  EKG  ED ECG REPORT I, Jene Every, the attending physician, personally viewed and  interpreted this ECG.  Date: 05/29/2020  Rhythm: normal sinus rhythm QRS Axis: normal Intervals: Abnormal ST/T Wave abnormalities: normal Narrative Interpretation: no evidence of acute ischemia  ____________________________________________  RADIOLOGY  Chest x-ray viewed by me, no acute abnormality CT angiography chest without evidence of dissection ____________________________________________   PROCEDURES  Procedure(s) performed: No  Procedures   Critical Care performed: No ____________________________________________   INITIAL IMPRESSION / ASSESSMENT AND PLAN / ED COURSE  Pertinent labs & imaging results that were available during my care of the patient were reviewed by me and considered in my medical decision making (see chart for details).  Patient presents with chest pressure back pain and cramping in upper arms bilaterally with an episode of diaphoresis also markedly hypertensive which is atypical for her.  Concerning for possible dissection, EKG demonstrates bundle-branch blocks but no significant ST abnormalities.  First high-sensitivity troponin is 16  Patient appears more comfortable when lying down but reports when she lifted up her arms she developed chest pressure and back pain when getting x-ray.  We will send for CT angiography   CT angiography negative for dissection.  Blood pressure is improved after administration of IV labetalol.  We will discuss with the hospitalist for admission for high risk chest pain    ____________________________________________   FINAL CLINICAL IMPRESSION(S) / ED DIAGNOSES  Final diagnoses:  Chest pain, unspecified type        Note:  This document was prepared using Dragon voice recognition software and may include unintentional dictation errors.   Jene Every, MD 05/29/20 [phone]

## 2020-05-29 NOTE — ED Notes (Signed)
Pt transported to MRI.  Per MRI, will transport to room from there.

## 2020-05-29 NOTE — ED Triage Notes (Signed)
Pt to ED via POV with c/o Hypertension. Per [facility] staff pt with BP 180-200, c/o heaviness to L arm, that radiates across chest to R arm. Pt presents A&O. Pt states hx of HTN, has not taken her HTN medication this morning.

## 2020-05-29 NOTE — Consult Note (Signed)
CARDIOLOGY CONSULT NOTE               Patient ID: Cindy Greene MRN: [id number] DOB/AGE: 02-18-1940 80 y.o.  Admit date: 05/29/2020 Referring Physician Dr. Lorretta Harp MD hospitalist Primary Physician Marcelino Duster primary Primary Cardiologist none Reason for Consultation chest pain possible angina hypertensive urgency  HPI: Patient history of hypertension vertigo chest pain arthritis patient states at home she had an episode where she had bilateral arm pain heaviness some pain in her back sweating mild nausea mild shortness of breath no chest pain however no blackout spells or syncope.  Patient was seen in the emergency room treated for blood pressure brought under control her symptoms somewhat improved in terms of salt soreness and heaviness in the arm no chest pain swelling is resolved EKG remains nondiagnostic.  No smoking denies any previous cardiac history  Review of systems complete and found to be negative unless listed above     Past Medical History:  Diagnosis Date  . Arthritis    osteoarthritis  . GERD (gastroesophageal reflux disease)   . Hypertension   . Positional vertigo     Past Surgical History:  Procedure Laterality Date  . ANKLE SURGERY    . APPENDECTOMY    . COLONOSCOPY WITH PROPOFOL N/A 09/24/2015   Procedure: COLONOSCOPY WITH PROPOFOL;  Surgeon: Scot Jun, MD;  Location: [facility] ENDOSCOPY;  Service: Endoscopy;  Laterality: N/A;  . excision ganglion cyst wrist    . KNEE ARTHROSCOPY WITH MENISCAL REPAIR Right 04/20/2016   Procedure: KNEE ARTHROSCOPY WITH MENISCAL REPAIR;  Surgeon: Christena Flake, MD;  Location: [facility];  Service: Orthopedics;  Laterality: Right;  . left foot reconstrucion    . right foot reconstruction    . TONSILLECTOMY      Medications Prior to Admission  Medication Sig Dispense Refill Last Dose  . Calcium Carb-Cholecalciferol (CALCIUM CARBONATE-VITAMIN D3 PO) Take by mouth.   05/28/2020 at Unknown time  .  cetirizine (ZYRTEC) 10 MG tablet Take 10 mg by mouth daily.   05/28/2020 at Unknown time  . Cholecalciferol (VITAMIN D3) 1000 units CAPS Take by mouth.   05/28/2020 at Unknown time  . estradiol (CLIMARA - DOSED IN MG/24 HR) 0.05 mg/24hr patch Place 0.05 mg onto the skin once a week.   05/24/2020  . Ginkgo Biloba 40 MG TABS Take by mouth.   05/28/2020 at Unknown time  . Glucos-MSM-C-Mn-Ginger-Willow (MSM GLUCOSAMINE COMPLEX PO) Take by mouth.   05/28/2020 at Unknown time  . hydrochlorothiazide (HYDRODIURIL) 25 MG tablet Take 25 mg by mouth daily.   05/28/2020 at Unknown time  . ibuprofen (ADVIL) 200 MG tablet Take 600 mg by mouth 3 (three) times daily.   05/28/2020 at Unknown time  . Meclizine HCl (BONINE PO) Take 1 tablet by mouth as needed.   prn at prn  . Omega-3 Fatty Acids (FISH OIL CONCENTRATE PO) Take by mouth.   05/28/2020 at Unknown time  . omeprazole (PRILOSEC) 20 MG capsule Take 20 mg by mouth daily.   05/28/2020 at Unknown time  . progesterone (PROMETRIUM) 200 MG capsule Take 200 mg by mouth daily.   05/28/2020 at Unknown time  . traMADol (ULTRAM) 50 MG tablet Take 50 mg by mouth every 8 (eight) hours as needed for pain.   05/28/2020 at Unknown time  . acetaminophen (TYLENOL) 650 MG CR tablet Take 650 mg by mouth every 8 (eight) hours as needed for pain. (Patient not taking: Reported on 05/29/2020)  Not Taking at Unknown time  . diphenhydramine-acetaminophen (TYLENOL PM) 25-500 MG TABS tablet Take 1 tablet by mouth at bedtime as needed. (Patient not taking: Reported on 05/29/2020)   Not Taking at Unknown time  . fluticasone (FLONASE) 50 MCG/ACT nasal spray Place into both nostrils daily. (Patient not taking: Reported on 05/29/2020)   Completed Course at Unknown time  . HYDROcodone-acetaminophen (NORCO) 5-325 MG tablet Take 1-2 tablets by mouth every 6 (six) hours as needed for moderate pain. MAXIMUM TOTAL ACETAMINOPHEN DOSE IS 4000 MG PER DAY (Patient not taking: Reported on 05/29/2020) 40 tablet 0  Completed Course at Unknown time  . meloxicam (MOBIC) 15 MG tablet Take 15 mg by mouth daily. (Patient not taking: Reported on 05/29/2020)   Not Taking at Unknown time   Social History   Socioeconomic History  . Marital status: Married    Spouse name: Not on file  . Number of children: Not on file  . Years of education: Not on file  . Highest education level: Not on file  Occupational History  . Not on file  Tobacco Use  . Smoking status: Never Smoker  . Smokeless tobacco: Never Used  Substance and Sexual Activity  . Alcohol use: No  . Drug use: Never  . Sexual activity: Not on file  Other Topics Concern  . Not on file  Social History Narrative  . Not on file   Social Determinants of Health   Financial Resource Strain: Not on file  Food Insecurity: Not on file  Transportation Needs: Not on file  Physical Activity: Not on file  Stress: Not on file  Social Connections: Not on file  Intimate Partner Violence: Not on file    History reviewed. No pertinent family history.    Review of systems complete and found to be negative unless listed above      PHYSICAL EXAM  General: Well developed, well nourished, in no acute distress HEENT:  Normocephalic and atramatic Neck:  No JVD.  Lungs: Clear bilaterally to auscultation and percussion. Heart: HRRR . Normal S1 and S2 without gallops or murmurs.  Abdomen: Bowel sounds are positive, abdomen soft and non-tender  Msk:  Back normal, normal gait. Normal strength and tone for age. Extremities: No clubbing, cyanosis or edema.   Neuro: Alert and oriented X 3. Psych:  Good affect, responds appropriately  Labs:   Lab Results  Component Value Date   WBC 8.2 05/29/2020   HGB 16.1 (H) 05/29/2020   HCT 47.5 (H) 05/29/2020   MCV 89.8 05/29/2020   PLT 326 05/29/2020    Recent Labs  Lab 05/29/20 0824  NA 137  K 3.7  CL 103  CO2 25  BUN 17  CREATININE 0.72  CALCIUM 8.8*  GLUCOSE 130*   No results found for: CKTOTAL,  CKMB, CKMBINDEX, TROPONINI No results found for: CHOL No results found for: HDL No results found for: LDLCALC No results found for: TRIG No results found for: CHOLHDL No results found for: LDLDIRECT    Radiology: DG Chest 2 View  Result Date: 05/29/2020 CLINICAL DATA:  Heaviness in chest. EXAM: CHEST - 2 VIEW COMPARISON:  None. FINDINGS: The lungs are clear without focal pneumonia, edema, pneumothorax or pleural effusion. The cardiopericardial silhouette is within normal limits for size. The visualized bony structures of the thorax show no acute abnormality. Telemetry leads overlie the chest. IMPRESSION: No active cardiopulmonary disease. Electronically Signed   By: Kennith Center M.D.   On: 05/29/2020 09:19   MR  BRAIN WO CONTRAST  Result Date: 05/29/2020 CLINICAL DATA:  TIA.  History of hypertension EXAM: MRI HEAD WITHOUT CONTRAST TECHNIQUE: Multiplanar, multiecho pulse sequences of the brain and surrounding structures were obtained without intravenous contrast. COMPARISON:  None. FINDINGS: Brain: Ventricle size and cerebral volume normal for age. Mild white matter changes with scattered small deep white matter hyperintensities bilaterally. Mild hyperintensity in the pons bilaterally. Negative for acute infarct, hemorrhage, mass. Vascular: Normal arterial flow void Skull and upper cervical spine: No focal skeletal lesion. Sinuses/Orbits: Moderate mucosal edema left maxillary sinus. Remaining sinuses clear. Negative orbit Other: None IMPRESSION: No acute abnormality Mild chronic microvascular ischemic change. Electronically Signed   By: Marlan Palau M.D.   On: 05/29/2020 14:07   CT ANGIO CHEST AORTA W/CM & OR WO/CM  Result Date: 05/29/2020 CLINICAL DATA:  80 year old female with chest and back pain. Possible dissection. EXAM: CT ANGIOGRAPHY CHEST WITH CONTRAST TECHNIQUE: Multidetector CT imaging of the chest was performed using the standard protocol prior to and during bolus administration of  intravenous contrast. Multiplanar CT image reconstructions and MIPs were obtained to evaluate the vascular anatomy. CONTRAST:  32mL OMNIPAQUE IOHEXOL 350 MG/ML SOLN COMPARISON:  Chest radiographs 0857 hours today. FINDINGS: Cardiovascular: Calcified coronary artery and aortic atherosclerosis. Following contrast the thoracic and visible upper abdominal aorta is otherwise normal. No cardiomegaly.  No pericardial effusion. Mediastinum/Nodes: Negative. Small mediastinal and hilar lymph nodes are within normal limits. Lungs/Pleura: Major airways are patent. Relatively normal lung volumes with mild mosaic attenuation in both lungs, most apparent in the inferior upper and right lower lobes. No pleural effusion. Mild scarring or atelectasis in both middle lobes. Tiny clustered peribronchial nodules in the distal right upper lobe near the major fissure on series 7, image 59. Tiny calcified granuloma in that lobe on series 7, image 38. Upper Abdomen: Negative visible liver, gallbladder, spleen, pancreas, adrenal glands, kidneys, and bowel in the upper abdomen. Musculoskeletal: Bone mineralization is within normal limits. Preserved thoracic vertebral height and alignment. No acute osseous abnormality identified. Review of the MIP images confirms the above findings. IMPRESSION: 1. Negative for thoracic aortic dissection or aneurysm. Positive for Aortic Atherosclerosis (ICD10-I70.0). 2. Tiny postinflammatory appearing right upper lobe lung nodules. 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 Electronically Signed   By: Odessa Fleming M.D.   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 Plan Agreed admit for rule out myocardial infarction  Follow-up EKGs and troponins Recommend IV  heparin until patient rules out Echocardiogram for assessment of left ventricular function wall motion Continue Protonix therapy for GERD Continue aggressive blood pressure control Consider functional study versus cardiac cath to evaluate possible ACS  Signed: Alwyn Pea MD 05/29/2020, 3:57 PM

## 2020-05-29 NOTE — Progress Notes (Signed)
Brief Consult Note Imp HTN urgency  Possible ACS Abnormal EKG   Plan Tele R/OMI EKGs Echo Heparin IV 24-48 hr or until romi  Function study vs Cath Possible outpatient W/u I will reevaluate tomorrow  Thanks, Elber Galyean

## 2020-05-29 NOTE — H&P (Addendum)
History and Physical    Cindy Greene ZOX:[id number] DOB: 07/26/1940 DOA: 05/29/2020  Referring MD/NP/PA:   PCP: Gracelyn NurseJohnston, John D, MD   Patient coming from:  The patient is coming from home.  At baseline, pt is independent for most of ADL.        Chief Complaint: Elevated blood pressure, chest pain and discomfort  HPI: Cindy Greene is a 80 y.o. female with medical history significant of hypertension, vertigo, osteoarthritis, gerd, who presents with elevated blood pressure, chest pain and discomfort.  Pt states that she started feeling heavy in left arm at about 6:30. No numbness or tingling in the left arm.  No facial droop or slurred speech. She also describes cramping in her arms bilaterally. It is associated with diaphoresis. She reports that she had an episode of diaphoresis that was so bad that even her underwear were soaked.  Patient denies chest pain or palpitation to me.  No cough, fever or chills.  Patient has some nausea and vomited earlier.  Denies abdominal pain or diarrhea.  No symptoms of UTI.  Pt was found to have blood pressure 207/89, which improved to 156/73 after giving 10 mg of labetalol in ED.  ED Course: pt was found to have troponin level 16, pending COVID-19 PCR, electrolytes renal function okay, temperature normal, heart rate 81, RR 19, oxygen saturation 95% on room air.  Chest x-ray negative.  CT angiograms of chest negative for dissection or aneurysm.  Patient is placed on progressive bed of observation.  Dr. Juliann Paresallwood of cardiology is consulted.   Review of Systems:   General: no fevers, chills, no body weight gain,  has fatigue HEENT: no blurry vision, hearing changes or sore throat Respiratory: no dyspnea, coughing, wheezing CV: no chest pain, no palpitations GI: no nausea, vomiting, abdominal pain, diarrhea, constipation GU: no dysuria, burning on urination, increased urinary frequency, hematuria  Ext: no leg edema Neuro: no unilateral  weakness, numbness, or tingling, no vision change or hearing loss Skin: no rash, no skin tear. MSK: No muscle spasm, no deformity, no limitation of range of movement in spin Heme: No easy bruising.  Travel history: No recent long distant travel.  Allergy:  Allergies  Allergen Reactions  . Ciprofloxacin     Other reaction(s): Unknown  . Tramadol Other (See Comments)    Heart fluttering; felt like she might faint; is able to take it at a lower dose, and it is okay    Past Medical History:  Diagnosis Date  . Arthritis    osteoarthritis  . GERD (gastroesophageal reflux disease)   . Hypertension   . Positional vertigo     Past Surgical History:  Procedure Laterality Date  . ANKLE SURGERY    . APPENDECTOMY    . COLONOSCOPY WITH PROPOFOL N/A 09/24/2015   Procedure: COLONOSCOPY WITH PROPOFOL;  Surgeon: Scot Junobert T Elliott, MD;  Location[facility] END[facility]COPY;  Service: Endoscopy;  Laterality: N/A;  . excision ganglion cyst wrist    . KNEE ARTHROSCOPY WITH MENISCAL REPAIR Right 04/20/2016   Procedure: KNEE ARTHROSCOPY WITH MENISCAL REPAIR;  Surgeon: Christena FlakeJohn J Poggi, MD;  [facility];  Service: Orthopedics;  Laterality: Right;  . left foot reconstrucion    . right foot reconstruction    . TONSILLECTOMY      Social History:  reports that she has never smoked. She has never used smokeless tobacco. She reports that she does not drink alcohol and does not use drugs.  Family  History:  Family History  Problem Relation Age of Onset  . Diabetes Mellitus II Mother   . Hypertension Mother   . Heart disease Father   . Hypertension Father   . Diabetes Mellitus II Sister   . Diabetes Mellitus II Brother      Prior to Admission medications   Medication Sig Start Date End Date Taking? Authorizing Provider  acetaminophen (TYLENOL) 650 MG CR tablet Take 650 mg by mouth every 8 (eight) hours as needed for pain.    [provider]  Calcium Carb-Cholecalciferol (CALCIUM  CARBONATE-VITAMIN D3 PO) Take by mouth.    [provider]  cetirizine (ZYRTEC) 10 MG tablet Take 10 mg by mouth daily.    [provider]  Cholecalciferol (VITAMIN D3) 1000 units CAPS Take by mouth.    [provider]  diphenhydramine-acetaminophen (TYLENOL PM) 25-500 MG TABS tablet Take 1 tablet by mouth at bedtime as needed.    [provider]  estradiol (CLIMARA - DOSED IN MG/24 HR) 0.05 mg/24hr patch Place 0.05 mg onto the skin once a week.    [provider]  fluticasone (FLONASE) 50 MCG/ACT nasal spray Place into both nostrils daily.    [provider]  Ginkgo Biloba 40 MG TABS Take by mouth.    [provider]  Glucos-MSM-C-Mn-Ginger-Willow (MSM GLUCOSAMINE COMPLEX PO) Take by mouth.    [provider]  hydrochlorothiazide (HYDRODIURIL) 25 MG tablet Take 25 mg by mouth daily.    [provider]  HYDROcodone-acetaminophen (NORCO) 5-325 MG tablet Take 1-2 tablets by mouth every 6 (six) hours as needed for moderate pain. MAXIMUM TOTAL ACETAMINOPHEN DOSE IS 4000 MG PER DAY 04/20/16   Poggi, Excell Seltzer, MD  meloxicam (MOBIC) 15 MG tablet Take 15 mg by mouth daily.    [provider]  Omega-3 Fatty Acids (FISH OIL CONCENTRATE PO) Take by mouth.    [provider]  progesterone (PROMETRIUM) 200 MG capsule Take 200 mg by mouth daily.    [provider]    Physical Exam: Vitals:   05/29/20 1335 05/29/20 1431 05/29/20 1433 05/29/20 1646  BP: (!) 177/54 (!) 157/70  (!) 157/60  Pulse: 76 78  69  Resp: 16 18  18   Temp: 98.1 F (36.7 C) 98.6 F (37 C)  97.7 F (36.5 C)  TempSrc: Oral     SpO2: 95% 95%  96%  Weight:   73.9 kg   Height:       General: Not in acute distress HEENT:       Eyes: PERRL, EOMI, no scleral icterus.       ENT: No discharge from the ears and nose, no pharynx injection, no tonsillar enlargement.        Neck: No JVD, no bruit, no mass felt. Heme: No neck lymph node  enlargement. Cardiac: S1/S2, RRR, No murmurs, No gallops or rubs. Respiratory: No rales, wheezing, rhonchi or rubs. GI: Soft, nondistended, nontender, no rebound pain, no organomegaly, BS present. GU: No hematuria Ext: No pitting leg edema bilaterally. 1+DP/PT pulse bilaterally. Musculoskeletal: No joint deformities, No joint redness or warmth, no limitation of ROM in spin. Skin: No rashes.  Neuro: Alert, oriented X3, cranial nerves II-XII grossly intact, moves all extremities normally. Muscle strength 5/5 in all extremities, sensation to light touch intact. Brachial reflex 2+ bilaterally. Knee reflex 1+ bilaterally. Negative Babinski's sign. Normal finger to nose test. Psych: Patient is not psychotic, no suicidal or hemocidal ideation.  Labs on Admission: I have  personally reviewed following labs and imaging studies  CBC: Recent Labs  Lab 05/29/20 0824  WBC 8.2  HGB 16.1*  HCT 47.5*  MCV 89.8  PLT 326   Basic Metabolic Panel: Recent Labs  Lab 05/29/20 0824  NA 137  K 3.7  CL 103  CO2 25  GLUCOSE 130*  BUN 17  CREATININE 0.72  CALCIUM 8.8*   GFR: Estimated Creatinine Clearance: 52.1 mL/min (by C-G formula based on SCr of 0.72 mg/dL). Liver Function Tests: No results for input(s): AST, ALT, ALKPHOS, BILITOT, PROT, ALBUMIN in the last 168 hours. No results for input(s): LIPASE, AMYLASE in the last 168 hours. No results for input(s): AMMONIA in the last 168 hours. Coagulation Profile: No results for input(s): INR, PROTIME in the last 168 hours. Cardiac Enzymes: No results for input(s): CKTOTAL, CKMB, CKMBINDEX, TROPONINI in the last 168 hours. BNP (last 3 results) No results for input(s): PROBNP in the last 8760 hours. HbA1C: No results for input(s): HGBA1C in the last 72 hours. CBG: No results for input(s): GLUCAP in the last 168 hours. Lipid Profile: No results for input(s): CHOL, HDL, LDLCALC, TRIG, CHOLHDL, LDLDIRECT in the last 72 hours. Thyroid Function  Tests: No results for input(s): TSH, T4TOTAL, FREET4, T3FREE, THYROIDAB in the last 72 hours. Anemia Panel: No results for input(s): VITAMINB12, FOLATE, FERRITIN, TIBC, IRON, RETICCTPCT in the last 72 hours. Urine analysis: No results found for: COLORURINE, APPEARANCEUR, LABSPEC, PHURINE, GLUCOSEU, HGBUR, BILIRUBINUR, KETONESUR, PROTEINUR, UROBILINOGEN, NITRITE, LEUKOCYTESUR Sepsis Labs: @LABRCNTIP (procalcitonin:4,lacticidven:4) )No results found for this or any previous visit (from the past 240 hour(s)).   Radiological Exams on Admission: DG Chest 2 View  Result Date: 05/29/2020 CLINICAL DATA:  Heaviness in chest. EXAM: CHEST - 2 VIEW COMPARISON:  None. FINDINGS: The lungs are clear without focal pneumonia, edema, pneumothorax or pleural effusion. The cardiopericardial silhouette is within normal limits for size. The visualized bony structures of the thorax show no acute abnormality. Telemetry leads overlie the chest. IMPRESSION: No active cardiopulmonary disease. Electronically Signed   By: 05/31/2020 M.D.   On: 05/29/2020 09:19   MR BRAIN WO CONTRAST  Result Date: 05/29/2020 CLINICAL DATA:  TIA.  History of hypertension EXAM: MRI HEAD WITHOUT CONTRAST TECHNIQUE: Multiplanar, multiecho pulse sequences of the brain and surrounding structures were obtained without intravenous contrast. COMPARISON:  None. FINDINGS: Brain: Ventricle size and cerebral volume normal for age. Mild white matter changes with scattered small deep white matter hyperintensities bilaterally. Mild hyperintensity in the pons bilaterally. Negative for acute infarct, hemorrhage, mass. Vascular: Normal arterial flow void Skull and upper cervical spine: No focal skeletal lesion. Sinuses/Orbits: Moderate mucosal edema left maxillary sinus. Remaining sinuses clear. Negative orbit Other: None IMPRESSION: No acute abnormality Mild chronic microvascular ischemic change. Electronically Signed   By: 05/31/2020 M.D.   On: 05/29/2020  14:07   CT ANGIO CHEST AORTA W/CM & OR WO/CM  Result Date: 05/29/2020 CLINICAL DATA:  80 year old female with chest and back pain. Possible dissection. EXAM: CT ANGIOGRAPHY CHEST WITH CONTRAST TECHNIQUE: Multidetector CT imaging of the chest was performed using the standard protocol prior to and during bolus administration of intravenous contrast. Multiplanar CT image reconstructions and MIPs were obtained to evaluate the vascular anatomy. CONTRAST:  2mL OMNIPAQUE IOHEXOL 350 MG/ML SOLN COMPARISON:  Chest radiographs 0857 hours today. FINDINGS: Cardiovascular: Calcified coronary artery and aortic atherosclerosis. Following contrast the thoracic and visible upper abdominal aorta is otherwise normal. No cardiomegaly.  No pericardial effusion. Mediastinum/Nodes: Negative. Small mediastinal and hilar lymph  nodes are within normal limits. Lungs/Pleura: Major airways are patent. Relatively normal lung volumes with mild mosaic attenuation in both lungs, most apparent in the inferior upper and right lower lobes. No pleural effusion. Mild scarring or atelectasis in both middle lobes. Tiny clustered peribronchial nodules in the distal right upper lobe near the major fissure on series 7, image 59. Tiny calcified granuloma in that lobe on series 7, image 38. Upper Abdomen: Negative visible liver, gallbladder, spleen, pancreas, adrenal glands, kidneys, and bowel in the upper abdomen. Musculoskeletal: Bone mineralization is within normal limits. Preserved thoracic vertebral height and alignment. No acute osseous abnormality identified. Review of the MIP images confirms the above findings. IMPRESSION: 1. Negative for thoracic aortic dissection or aneurysm. Positive for Aortic Atherosclerosis (ICD10-I70.0). 2. Tiny postinflammatory appearing right upper lobe lung nodules. 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 Electronically Signed   By: Odessa Fleming M.D.   On:  05/29/2020 10:56     EKG: I have personally reviewed.  Sinus rhythm, QTC 499, bifascicular block, early R progression   Assessment/Plan Principal Problem:   Hypertensive urgency Active Problems:   Arm heaviness   GERD (gastroesophageal reflux disease)   Vertigo     Hypertensive urgency: Bp 207/89 -->157/73 after giving 10 mg of labetalol. Pt is taking HCTZ 25 mg daily at home.  Patient complains of arm heaviness, but her MRI of brain is negative for stroke.  Patient complains of severe diaphoresis, concerning for possible ACS.  Dr. Juliann Pares is consulted, he recommended to start IV heparin.  -Placed on progressive bed follow-up physician -IV heparin per Dr. Juliann Pares -Continue HCTZ -Add amlodipine 5 mg daily -Trend troponin -Started Lipitor 40 mg daily -Check UDS, A1c, FLP -2D echo  Arm heaviness: MRI of her brain is negative for stroke.  Possibly related to hypertensive urgency -Observe closely  GERD (gastroesophageal reflux disease) -Protonix  Vertigo -As needed meclizine    DVT ppx: SQ Lovenox Code Status: Full code Family Communication:   Yes, patient's daughter    at bed side Disposition Plan:  Anticipate discharge back to previous environment Consults called:  Dr. Juliann Pares of cardiology is consulted Admission status and Level of care: Progressive Cardiac:   for obs   Status is: Observation  The patient remains OBS appropriate and will d/c before 2 midnights.  Dispo: The patient is from: Home              Anticipated d/c is to: Home              Patient currently is not medically stable to d/c.   Difficult to place patient No        Date of Service 05/29/2020    Lorretta Harp [facility]   If 7PM-7AM, please contact night-coverage [email or web address] 05/29/2020, 6:02 PM

## 2020-05-29 NOTE — Progress Notes (Signed)
ANTICOAGULATION CONSULT NOTE  Pharmacy Consult for heparin infusion Indication: chest pain/ACS  Patient Measurements: Height: 5' (152.4 cm) Weight: 73.9 kg (162 lb 14.4 oz) IBW/kg (Calculated) : 45.5 Heparin Dosing Weight: 61.6 kg  Vital Signs: Temp: 98.6 F (37 C) (05/20 1431) Temp Source: Oral (05/20 1335) BP: 157/70 (05/20 1431) Pulse Rate: 78 (05/20 1431)  Labs: Recent Labs    05/29/20 0824 05/29/20 1130 05/29/20 1438  HGB 16.1*  --   --   HCT 47.5*  --   --   PLT 326  --   --   CREATININE 0.72  --   --   TROPONINIHS 16 16 18*    Estimated Creatinine Clearance: 52.1 mL/min (by C-G formula based on SCr of 0.72 mg/dL).   Medical History: Past Medical History:  Diagnosis Date  . Arthritis    osteoarthritis  . GERD (gastroesophageal reflux disease)   . Hypertension   . Positional vertigo     Medications:  Scheduled:  . [START ON 05/30/2020] cholecalciferol  1,000 Units Oral Daily  . hydrochlorothiazide  25 mg Oral Daily  . loratadine  10 mg Oral Daily  . losartan  25 mg Oral Daily  . metoprolol tartrate  25 mg Oral BID  . [START ON 05/30/2020] omega-3 acid ethyl esters  1 g Oral Daily  . [START ON 05/30/2020] Oyster Shell Calcium/D  1 tablet Oral Daily  . pantoprazole  40 mg Oral Daily  . progesterone  200 mg Oral Daily    Assessment: 80 y.o. female who presents with complaints of high blood pressure, chest discomfort and back pain. She was evaluated by cardiology who is starting a heparin infusion for possible ACS. She is on no chronic anticoagulation prior to arrival.  Goal of Therapy:  Heparin level 0.3-0.7 units/ml Monitor platelets by anticoagulation protocol: Yes   Plan:  Give 3500 units bolus x 1 Start heparin infusion at 800 units/hr Check anti-Xa level in 8 hours and daily while on heparin Continue to monitor H&H and platelets  Lowella Bandy 05/29/2020,4:33 PM

## 2020-05-30 DIAGNOSIS — I160 Hypertensive urgency: Secondary | ICD-10-CM | POA: Diagnosis not present

## 2020-05-30 LAB — BASIC METABOLIC PANEL
Anion gap: 9 (ref 5–15)
BUN: 17 mg/dL (ref 8–23)
CO2: 23 mmol/L (ref 22–32)
Calcium: 8.8 mg/dL — ABNORMAL LOW (ref 8.9–10.3)
Chloride: 105 mmol/L (ref 98–111)
Creatinine, Ser: 0.59 mg/dL (ref 0.44–1.00)
GFR, Estimated: >60 mL/min (ref >60)
Glucose, Bld: 124 mg/dL — ABNORMAL HIGH (ref 70–99)
Potassium: 3.7 mmol/L (ref 3.5–5.1)
Sodium: 137 mmol/L (ref 135–145)

## 2020-05-30 LAB — ECHOCARDIOGRAM COMPLETE
AR max vel: 2.18 cm2
AV Area VTI: 2.3 cm2
AV Area mean vel: 1.93 cm2
AV Mean grad: 4 mmHg
AV Peak grad: 7.7 mmHg
Ao pk vel: 1.39 m/s
Area-P 1/2: 2.44 cm2
Height: 60 in
S' Lateral: 2.78 cm
Weight: 2606.4 oz

## 2020-05-30 LAB — CBC
HCT: 43.6 % (ref 36.0–46.0)
Hemoglobin: 14.9 g/dL (ref 12.0–15.0)
MCH: 30.9 pg (ref 26.0–34.0)
MCHC: 34.2 g/dL (ref 30.0–36.0)
MCV: 90.5 fL (ref 80.0–100.0)
Platelets: 316 10*3/uL (ref 150–400)
RBC: 4.82 MIL/uL (ref 3.87–5.11)
RDW: 14 % (ref 11.5–15.5)
WBC: 10.9 10*3/uL — ABNORMAL HIGH (ref 4.0–10.5)
nRBC: 0 % (ref 0.0–0.2)

## 2020-05-30 LAB — LIPID PANEL
Cholesterol: 202 mg/dL — ABNORMAL HIGH (ref 0–200)
HDL: 46 mg/dL (ref >40)
LDL Cholesterol: 113 mg/dL — ABNORMAL HIGH (ref 0–99)
Total CHOL/HDL Ratio: 4.4 RATIO
Triglycerides: 217 mg/dL — ABNORMAL HIGH (ref <150)
VLDL: 43 mg/dL — ABNORMAL HIGH (ref 0–40)

## 2020-05-30 LAB — HEPARIN LEVEL (UNFRACTIONATED)
Heparin Unfractionated: 0.21 IU/mL — ABNORMAL LOW (ref 0.30–0.70)
Heparin Unfractionated: 0.36 IU/mL (ref 0.30–0.70)

## 2020-05-30 MED ORDER — LOSARTAN POTASSIUM 50 MG PO TABS: 50.0000 mg | ORAL_TABLET | Freq: Every day | ORAL | Status: DC

## 2020-05-30 MED ORDER — ASPIRIN 81 MG PO TBEC: 81.0000 mg | DELAYED_RELEASE_TABLET | Freq: Every day | ORAL | 11 refills | Status: AC

## 2020-05-30 MED ORDER — PROGESTERONE MICRONIZED 200 MG PO CAPS
200.0000 mg | ORAL_CAPSULE | Freq: Every day | ORAL | Status: DC
  Filled 2020-05-30: qty 1

## 2020-05-30 MED ORDER — ATORVASTATIN CALCIUM 20 MG PO TABS: 40.0000 mg | ORAL_TABLET | Freq: Every day | ORAL | 1 refills | Status: AC

## 2020-05-30 MED ORDER — HEPARIN BOLUS VIA INFUSION
900.0000 [IU] | INTRAVENOUS | Status: AC
  Administered 2020-05-30: 900 [IU] via INTRAVENOUS
  Filled 2020-05-30: qty 900

## 2020-05-30 MED ORDER — DILTIAZEM HCL ER COATED BEADS 180 MG PO CP24
180.0000 mg | ORAL_CAPSULE | Freq: Every day | ORAL | Status: DC
  Filled 2020-05-30: qty 1

## 2020-05-30 MED ORDER — METOPROLOL TARTRATE 50 MG PO TABS: 50.0000 mg | ORAL_TABLET | Freq: Two times a day (BID) | ORAL | Status: DC

## 2020-05-30 MED ORDER — LOSARTAN POTASSIUM 50 MG PO TABS: 50.0000 mg | ORAL_TABLET | Freq: Every day | ORAL | 1 refills | Status: AC

## 2020-05-30 MED ORDER — METOPROLOL TARTRATE 50 MG PO TABS: 50.0000 mg | ORAL_TABLET | Freq: Two times a day (BID) | ORAL | 0 refills | Status: AC

## 2020-05-30 NOTE — Discharge Summary (Signed)
Physician Discharge Summary  DAYJAH SELMAN [facility] TDV:[id number] DOB: July 13, 1940 DOA: 05/29/2020  PCP: Gracelyn Nurse, MD  Admit date: 05/29/2020 Discharge date: 05/30/2020  Admitted From: Home Disposition: Home  Recommendations for Outpatient Follow-up:  1. Follow up with PCP in 1-2 weeks 2. Follow-up with cardiology 3. Please obtain BMP/CBC in one week 4. Please follow up on the following pending results: None  Home Health: No Equipment/Devices: None Discharge Condition: Stable CODE STATUS: Full Diet recommendation: Heart Healthy   Brief/Interim Summary: Alayna Mabe is a 80 y.o. female with medical history significant of hypertension, vertigo, osteoarthritis, gerd, who presents with elevated blood pressure, chest pain and discomfort.  Pt states that she started feeling heavy in left arm at about 6:30. No numbness or tingling in the left arm.  No facial droop or slurred speech. She also describes cramping in her arms bilaterally. It is associated with diaphoresis.   She was found to have markedly elevated blood pressure at 207/89.  Blood pressure improved with intervention and resulted in resolution of her symptoms.  CT head and MRI was negative for any acute stroke.  Cardiology was also consulted and she initially received heparin infusion.  Troponin barely positive at 18 and 21.  Echocardiogram with normal EF but grade 3 diastolic dysfunction.  Cardiology started her on medical management and added losartan, Crestor, aspirin and metoprolol.  She will continue with her home dose of HCTZ along with these new medications and follow-up with them as an outpatient for further management.  Patient will continue with rest of her home medications and follow-up with her providers.  Discharge Diagnoses:  Principal Problem:   Hypertensive urgency Active Problems:   Arm heaviness   GERD (gastroesophageal reflux disease)   Vertigo   Discharge Instructions  Discharge Instructions     Diet - low sodium heart healthy   Complete by: As directed    Discharge instructions   Complete by: As directed    It was pleasure taking care of you. Your cardiologist started you on few new medications for your blood pressure and cholesterol, please take it as directed and follow-up closely with them for further management. Keep checking your blood pressure at home and keep a log and take that with you to your doctor's appointment.   Increase activity slowly   Complete by: As directed      Allergies as of 05/30/2020      Reactions   Ciprofloxacin    Other reaction(s): Unknown   Tramadol Other (See Comments)   Heart fluttering; felt like she might faint; is able to take it at a lower dose, and it is okay      Medication List    STOP taking these medications   diphenhydramine-acetaminophen 25-500 MG Tabs tablet Commonly known as: TYLENOL PM   fluticasone 50 MCG/ACT nasal spray Commonly known as: FLONASE   HYDROcodone-acetaminophen 5-325 MG tablet Commonly known as: Norco   meloxicam 15 MG tablet Commonly known as: MOBIC     TAKE these medications   acetaminophen 650 MG CR tablet Commonly known as: TYLENOL Take 650 mg by mouth every 8 (eight) hours as needed for pain.   aspirin 81 MG EC tablet Take 1 tablet (81 mg total) by mouth daily. Swallow whole. Start taking on: May 31, 2020   atorvastatin 20 MG tablet Commonly known as: LIPITOR Take 2 tablets (40 mg total) by mouth daily. Start taking on: May 31, 2020   BONINE PO Take 1 tablet  by mouth as needed.   CALCIUM CARBONATE-VITAMIN D3 PO Take by mouth.   cetirizine 10 MG tablet Commonly known as: ZYRTEC Take 10 mg by mouth daily.   estradiol 0.05 mg/24hr patch Commonly known as: CLIMARA - Dosed in mg/24 hr Place 0.05 mg onto the skin once a week.   FISH OIL CONCENTRATE PO Take by mouth.   Ginkgo Biloba 40 MG Tabs Take by mouth.   hydrochlorothiazide 25 MG tablet Commonly known as:  HYDRODIURIL Take 25 mg by mouth daily.   ibuprofen 200 MG tablet Commonly known as: ADVIL Take 600 mg by mouth 3 (three) times daily.   losartan 50 MG tablet Commonly known as: COZAAR Take 1 tablet (50 mg total) by mouth daily. Start taking on: May 31, 2020   metoprolol tartrate 50 MG tablet Commonly known as: LOPRESSOR Take 1 tablet (50 mg total) by mouth 2 (two) times daily.   MSM GLUCOSAMINE COMPLEX PO Take by mouth.   omeprazole 20 MG capsule Commonly known as: PRILOSEC Take 20 mg by mouth daily.   progesterone 200 MG capsule Commonly known as: PROMETRIUM Take 200 mg by mouth daily.   traMADol 50 MG tablet Commonly known as: ULTRAM Take 50 mg by mouth every 8 (eight) hours as needed for pain.   Vitamin D3 25 MCG (1000 UT) Caps Take by mouth.       Follow-up Information    Gracelyn NurseJohnston, John D, MD. Schedule an appointment as soon as possible for a visit.   Specialty: Internal Medicine Contact information: [address] [place] [address] [phone]              Allergies  Allergen Reactions  . Ciprofloxacin     Other reaction(s): Unknown  . Tramadol Other (See Comments)    Heart fluttering; felt like she might faint; is able to take it at a lower dose, and it is okay    Consultations:  Cardiology  Procedures/Studies: DG Chest 2 View  Result Date: 05/29/2020 CLINICAL DATA:  Heaviness in chest. EXAM: CHEST - 2 VIEW COMPARISON:  None. FINDINGS: The lungs are clear without focal pneumonia, edema, pneumothorax or pleural effusion. The cardiopericardial silhouette is within normal limits for size. The visualized bony structures of the thorax show no acute abnormality. Telemetry leads overlie the chest. IMPRESSION: No active cardiopulmonary disease. Electronically Signed   By: Kennith CenterEric  Mansell M.D.   On: 05/29/2020 09:19   MR BRAIN WO CONTRAST  Result Date: 05/29/2020 CLINICAL DATA:  TIA.  History of hypertension EXAM: MRI HEAD WITHOUT CONTRAST  TECHNIQUE: Multiplanar, multiecho pulse sequences of the brain and surrounding structures were obtained without intravenous contrast. COMPARISON:  None. FINDINGS: Brain: Ventricle size and cerebral volume normal for age. Mild white matter changes with scattered small deep white matter hyperintensities bilaterally. Mild hyperintensity in the pons bilaterally. Negative for acute infarct, hemorrhage, mass. Vascular: Normal arterial flow void Skull and upper cervical spine: No focal skeletal lesion. Sinuses/Orbits: Moderate mucosal edema left maxillary sinus. Remaining sinuses clear. Negative orbit Other: None IMPRESSION: No acute abnormality Mild chronic microvascular ischemic change. Electronically Signed   By: Marlan Palauharles  Clark M.D.   On: 05/29/2020 14:07   CT ANGIO CHEST AORTA W/CM & OR WO/CM  Result Date: 05/29/2020 CLINICAL DATA:  80 year old female with chest and back pain. Possible dissection. EXAM: CT ANGIOGRAPHY CHEST WITH CONTRAST TECHNIQUE: Multidetector CT imaging of the chest was performed using the standard protocol prior to and during bolus administration of intravenous contrast. Multiplanar CT image  reconstructions and MIPs were obtained to evaluate the vascular anatomy. CONTRAST:  75mL OMNIPAQUE IOHEXOL 350 MG/ML SOLN COMPARISON:  Chest radiographs 0857 hours today. FINDINGS: Cardiovascular: Calcified coronary artery and aortic atherosclerosis. Following contrast the thoracic and visible upper abdominal aorta is otherwise normal. No cardiomegaly.  No pericardial effusion. Mediastinum/Nodes: Negative. Small mediastinal and hilar lymph nodes are within normal limits. Lungs/Pleura: Major airways are patent. Relatively normal lung volumes with mild mosaic attenuation in both lungs, most apparent in the inferior upper and right lower lobes. No pleural effusion. Mild scarring or atelectasis in both middle lobes. Tiny clustered peribronchial nodules in the distal right upper lobe near the major fissure on  series 7, image 59. Tiny calcified granuloma in that lobe on series 7, image 38. Upper Abdomen: Negative visible liver, gallbladder, spleen, pancreas, adrenal glands, kidneys, and bowel in the upper abdomen. Musculoskeletal: Bone mineralization is within normal limits. Preserved thoracic vertebral height and alignment. No acute osseous abnormality identified. Review of the MIP images confirms the above findings. IMPRESSION: 1. Negative for thoracic aortic dissection or aneurysm. Positive for Aortic Atherosclerosis (ICD10-I70.0). 2. Tiny postinflammatory appearing right upper lobe lung nodules. 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 Electronically Signed   By: Odessa Fleming M.D.   On: 05/29/2020 10:56   ECHOCARDIOGRAM COMPLETE  Result Date: 05/30/2020    ECHOCARDIOGRAM REPORT   Patient Name:   NECHA HARRIES [facility] Date of Exam: 05/29/2020 Medical Rec #:  [id number]            Height:       60.0 in Accession #:    [id number]           Weight:       162.9 lb Date of Birth:  [birth year]             BSA:          1.711 m Patient Age:    80 years             BP:           157/60 mmHg Patient Gender: F                    HR:           79 bpm. Exam Location:  [facility] Procedure: 2D Echo, Cardiac Doppler and Color Doppler Indications:     ACS (acute coronary syndrome)  History:         Patient has no prior history of Echocardiogram examinations.                  Risk Factors:Hypertension.  Sonographer:     Tiburcio Pea, ANN Referring Phys:  Wynona Neat NIU Diagnosing Phys: Alwyn Pea MD  Sonographer Comments: Suboptimal apical window and no subcostal window. IMPRESSIONS  1. Hyperdynamic LVF , No obstruction noted.  2. Left ventricular ejection fraction, by estimation, is 70 to 75%. The left ventricle has hyperdynamic function. The left ventricle has no regional wall motion abnormalities. There is mild concentric left ventricular hypertrophy. Left ventricular diastolic  parameters are consistent with Grade III diastolic dysfunction (restrictive).  3. Right ventricular systolic function is normal. The right ventricular size is normal.  4. The mitral valve is normal in structure. Trivial mitral valve regurgitation.  5. The aortic valve is normal in structure. Aortic valve regurgitation is not visualized. FINDINGS  Left Ventricle: Left ventricular ejection fraction, by estimation, is 70 to 75%. The left  ventricle has hyperdynamic function. The left ventricle has no regional wall motion abnormalities. The left ventricular internal cavity size was normal in size. There is mild concentric left ventricular hypertrophy. Left ventricular diastolic parameters are consistent with Grade III diastolic dysfunction (restrictive). Right Ventricle: The right ventricular size is normal. No increase in right ventricular wall thickness. Right ventricular systolic function is normal. Left Atrium: Left atrial size was normal in size. Right Atrium: Right atrial size was normal in size. Pericardium: There is no evidence of pericardial effusion. Mitral Valve: The mitral valve is normal in structure. Trivial mitral valve regurgitation. Tricuspid Valve: The tricuspid valve is normal in structure. Tricuspid valve regurgitation is trivial. Aortic Valve: The aortic valve is normal in structure. Aortic valve regurgitation is not visualized. Aortic valve mean gradient measures 4.0 mmHg. Aortic valve peak gradient measures 7.7 mmHg. Aortic valve area, by VTI measures 2.30 cm. Pulmonic Valve: The pulmonic valve was normal in structure. Pulmonic valve regurgitation is not visualized. Aorta: The ascending aorta was not well visualized. IAS/Shunts: No atrial level shunt detected by color flow Doppler. Additional Comments: Hyperdynamic LVF , No obstruction noted.  LEFT VENTRICLE PLAX 2D LVIDd:         3.89 cm  Diastology LVIDs:         2.78 cm  LV e' medial:   55.80 cm/s LV PW:         1.35 cm  LV E/e' medial: 1.0 LV  IVS:        1.07 cm LVOT diam:     1.90 cm LV SV:         58 LV SV Index:   34 LVOT Area:     2.84 cm  LEFT ATRIUM         Index LA diam:    3.50 cm 2.05 cm/m  AORTIC VALVE AV Area (Vmax):    2.18 cm AV Area (Vmean):   1.93 cm AV Area (VTI):     2.30 cm AV Vmax:           139.00 cm/s AV Vmean:          99.400 cm/s AV VTI:            0.252 m AV Peak Grad:      7.7 mmHg AV Mean Grad:      4.0 mmHg LVOT Vmax:         107.00 cm/s LVOT Vmean:        67.800 cm/s LVOT VTI:          0.204 m LVOT/AV VTI ratio: 0.81  AORTA Ao Root diam: 2.50 cm MITRAL VALVE MV Area (PHT): 2.44 cm    SHUNTS MV Decel Time: 311 msec    Systemic VTI:  0.20 m MV E velocity: 55.20 cm/s  Systemic Diam: 1.90 cm Dwayne Salome Arnt MD Electronically signed by Alwyn Pea MD Signature Date/Time: 05/30/2020/11:17:53 AM    Final      Subjective: Patient was seen and examined today.  Family members at bedside.  Denies any chest pain or shortness of breath.  Sitting comfortably in chair.  Discharge Exam: Vitals:   05/30/20 0727 05/30/20 1141  BP: (!) 167/74 (!) 155/68  Pulse: 73 76  Resp: 16 16  Temp: 98.3 F (36.8 C) 98.5 F (36.9 C)  SpO2: 93% 96%   Vitals:   05/29/20 1933 05/30/20 0355 05/30/20 0727 05/30/20 1141  BP: (!) 143/63 (!) 159/64 (!) 167/74 (!) 155/68  Pulse: 79 78 73  76  Resp: 17 19 16 16   Temp: 98.1 F (36.7 C) 97.9 F (36.6 C) 98.3 F (36.8 C) 98.5 F (36.9 C)  TempSrc:    Oral  SpO2: 93% 94% 93% 96%  Weight:      Height:        General: Pt is alert, awake, not in acute distress Cardiovascular: RRR, S1/S2 +, no rubs, no gallops Respiratory: CTA bilaterally, no wheezing, no rhonchi Abdominal: Soft, NT, ND, bowel sounds + Extremities: no edema, no cyanosis   The results of significant diagnostics from this hospitalization (including imaging, microbiology, ancillary and laboratory) are listed below for reference.    Microbiology: Recent Results (from the past 240 hour(s))  SARS CORONAVIRUS  2 (TAT 6-24 HRS) Nasopharyngeal Nasopharyngeal Swab     Status: None   Collection Time: 05/29/20 11:30 AM   Specimen: Nasopharyngeal Swab  Result Value Ref Range Status   SARS Coronavirus 2 NEGATIVE NEGATIVE Final    Comment: (NOTE) SARS-CoV-2 target nucleic acids are NOT DETECTED.  The SARS-CoV-2 RNA is generally detectable in upper and lower respiratory specimens during the acute phase of infection. Negative results do not preclude SARS-CoV-2 infection, do not rule out co-infections with other pathogens, and should not be used as the sole basis for treatment or other patient management decisions. Negative results must be combined with clinical observations, patient history, and epidemiological information. The expected result is Negative.  Fact Sheet for Patients: 05/31/20  Fact Sheet for Healthcare Providers: HairSlick.no  This test is not yet approved or cleared by the [place] FDA and  has been authorized for detection and/or diagnosis of SARS-CoV-2 by FDA under an Emergency Use Authorization (EUA). This EUA will remain  in effect (meaning this test can be used) for the duration of the COVID-19 declaration under Se ction 564(b)(1) of the Act, 21 U.S.C. section 360bbb-3(b)(1), unless the authorization is terminated or revoked sooner.  Performed at [facility], [address], [place], [place]      Labs: BNP (last 3 results) No results for input(s): BNP in the last 8760 hours. Basic Metabolic Panel: Recent Labs  Lab 05/29/20 0824 05/30/20 0145  NA 137 137  K 3.7 3.7  CL 103 105  CO2 25 23  GLUCOSE 130* 124*  BUN 17 17  CREATININE 0.72 0.59  CALCIUM 8.8* 8.8*   Liver Function Tests: No results for input(s): AST, ALT, ALKPHOS, BILITOT, PROT, ALBUMIN in the last 168 hours. No results for input(s): LIPASE, AMYLASE in the last 168 hours. No results for input(s): AMMONIA in the  last 168 hours. CBC: Recent Labs  Lab 05/29/20 0824 05/30/20 0145  WBC 8.2 10.9*  HGB 16.1* 14.9  HCT 47.5* 43.6  MCV 89.8 90.5  PLT 326 316   Cardiac Enzymes: No results for input(s): CKTOTAL, CKMB, CKMBINDEX, TROPONINI in the last 168 hours. BNP: Invalid input(s): POCBNP CBG: No results for input(s): GLUCAP in the last 168 hours. D-Dimer No results for input(s): DDIMER in the last 72 hours. Hgb A1c No results for input(s): HGBA1C in the last 72 hours. Lipid Profile Recent Labs    05/30/20 0145  CHOL 202*  HDL 46  LDLCALC 113*  TRIG 217*  CHOLHDL 4.4   Thyroid function studies No results for input(s): TSH, T4TOTAL, T3FREE, THYROIDAB in the last 72 hours.  Invalid input(s): FREET3 Anemia work up No results for input(s): VITAMINB12, FOLATE, FERRITIN, TIBC, IRON, RETICCTPCT in the last 72 hours. Urinalysis No results found for:  COLORURINE, APPEARANCEUR, LABSPEC, PHURINE, GLUCOSEU, HGBUR, BILIRUBINUR, KETONESUR, PROTEINUR, UROBILINOGEN, NITRITE, LEUKOCYTESUR Sepsis Labs Invalid input(s): PROCALCITONIN,  WBC,  LACTICIDVEN Microbiology Recent Results (from the past 240 hour(s))  SARS CORONAVIRUS 2 (TAT 6-24 HRS) Nasopharyngeal Nasopharyngeal Swab     Status: None   Collection Time: 05/29/20 11:30 AM   Specimen: Nasopharyngeal Swab  Result Value Ref Range Status   SARS Coronavirus 2 NEGATIVE NEGATIVE Final    Comment: (NOTE) SARS-CoV-2 target nucleic acids are NOT DETECTED.  The SARS-CoV-2 RNA is generally detectable in upper and lower respiratory specimens during the acute phase of infection. Negative results do not preclude SARS-CoV-2 infection, do not rule out co-infections with other pathogens, and should not be used as the sole basis for treatment or other patient management decisions. Negative results must be combined with clinical observations, patient history, and epidemiological information. The expected result is Negative.  Fact Sheet for  Patients: HairSlick.no  Fact Sheet for Healthcare Providers: quierodirigir.com  This test is not yet approved or cleared by the [place] FDA and  has been authorized for detection and/or diagnosis of SARS-CoV-2 by FDA under an Emergency Use Authorization (EUA). This EUA will remain  in effect (meaning this test can be used) for the duration of the COVID-19 declaration under Se ction 564(b)(1) of the Act, 21 U.S.C. section 360bbb-3(b)(1), unless the authorization is terminated or revoked sooner.  Performed at [facility], [address], [place], [place] [address]     Time coordinating discharge: Over 30 minutes  SIGNED:  Arnetha Courser, MD  [facility] 05/30/2020, 12:36 PM  If 7PM-7AM, please contact night-coverage [email or web address]  This record has been created using Conservation officer, historic buildings. Errors have been sought and corrected,but may not always be located. Such creation errors do not reflect on the standard of care.

## 2020-05-30 NOTE — Progress Notes (Signed)
ANTICOAGULATION CONSULT NOTE  Pharmacy Consult for heparin infusion Indication: chest pain/ACS  Patient Measurements: Height: 5' (152.4 cm) Weight: 73.9 kg (162 lb 14.4 oz) IBW/kg (Calculated) : 45.5 Heparin Dosing Weight: 61.6 kg  Vital Signs: Temp: 98.1 F (36.7 C) (05/20 1933) BP: 143/63 (05/20 1933) Pulse Rate: 79 (05/20 1933)  Labs: Recent Labs    05/29/20 0824 05/29/20 1130 05/29/20 1438 05/29/20 1728 05/30/20 0145  HGB 16.1*  --   --   --  14.9  HCT 47.5*  --   --   --  43.6  PLT 326  --   --   --  316  HEPARINUNFRC  --   --   --   --  0.21*  CREATININE 0.72  --   --   --   --   TROPONINIHS 16 16 18* 21*  --     Estimated Creatinine Clearance: 52.1 mL/min (by C-G formula based on SCr of 0.72 mg/dL).   Medical History: Past Medical History:  Diagnosis Date  . Arthritis    osteoarthritis  . GERD (gastroesophageal reflux disease)   . Hypertension   . Positional vertigo     Medications:  Scheduled:  . amLODipine  5 mg Oral Daily  . aspirin EC  81 mg Oral Daily  . atorvastatin  40 mg Oral Daily  . cholecalciferol  1,000 Units Oral Daily  . heparin  900 Units Intravenous STAT  . hydrochlorothiazide  25 mg Oral Daily  . loratadine  10 mg Oral Daily  . losartan  25 mg Oral Daily  . metoprolol tartrate  25 mg Oral BID  . omega-3 acid ethyl esters  1 g Oral Daily  . Oyster Shell Calcium/D  1 tablet Oral Daily  . pantoprazole  40 mg Oral Daily  . progesterone  200 mg Oral Daily    Assessment: 80 y.o. female who presents with complaints of high blood pressure, chest discomfort and back pain. She was evaluated by cardiology who is starting a heparin infusion for possible ACS. She is on no chronic anticoagulation prior to arrival.  Goal of Therapy:  Heparin level 0.3-0.7 units/ml Monitor platelets by anticoagulation protocol: Yes   5/21 0145 HL 0.21   Plan:  Give 900 unit bolus x 1 Increase heparin infusion rate to 950 units/hr Recheck HL in 8 hr  after rate change CBC daily while on heparin  Otelia Sergeant, PharmD, [facility] 05/30/2020 2:30 AM

## 2020-05-30 NOTE — Progress Notes (Signed)
[facility]    SUBJECTIVE: States to doing reasonably well denies any chest pain no arm pain no diaphoresis or sweating feels reasonably well.  Blood pressure is reasonably controlled no palpitation tachycardia.  Patient ready to go home.   Vitals:   05/29/20 1933 05/30/20 0355 05/30/20 0727 05/30/20 1141  BP: (!) 143/63 (!) 159/64 (!) 167/74 (!) 155/68  Pulse: 79 78 73 76  Resp: 17 19 16 16   Temp: 98.1 F (36.7 C) 97.9 F (36.6 C) 98.3 F (36.8 C) 98.5 F (36.9 C)  TempSrc:    Oral  SpO2: 93% 94% 93% 96%  Weight:      Height:         Intake/Output Summary (Last 24 hours) at 05/30/2020 1323 Last data filed at 05/30/2020 1156 Gross per 24 hour  Intake 608.81 ml  Output 3260 ml  Net -2651.19 ml      PHYSICAL EXAM  General: Well developed, well nourished, in no acute distress HEENT:  Normocephalic and atramatic Neck:  No JVD.  Lungs: Clear bilaterally to auscultation and percussion. Heart: HRRR . Normal S1 and S2 without gallops or murmurs.  Abdomen: Bowel sounds are positive, abdomen soft and non-tender  Msk:  Back normal, normal gait. Normal strength and tone for age. Extremities: No clubbing, cyanosis or edema.   Neuro: Alert and oriented X 3. Psych:  Good affect, responds appropriately   LABS: Basic Metabolic Panel: Recent Labs    05/29/20 0824 05/30/20 0145  NA 137 137  K 3.7 3.7  CL 103 105  CO2 25 23  GLUCOSE 130* 124*  BUN 17 17  CREATININE 0.72 0.59  CALCIUM 8.8* 8.8*   Liver Function Tests: No results for input(s): AST, ALT, ALKPHOS, BILITOT, PROT, ALBUMIN in the last 72 hours. No results for input(s): LIPASE, AMYLASE in the last 72 hours. CBC: Recent Labs    05/29/20 0824 05/30/20 0145  WBC 8.2 10.9*  HGB 16.1* 14.9  HCT 47.5* 43.6  MCV 89.8 90.5  PLT 326 316   Cardiac Enzymes: No results for input(s): CKTOTAL, CKMB, CKMBINDEX, TROPONINI in the last 72 hours. BNP: Invalid input(s): POCBNP D-Dimer: No results for input(s):  DDIMER in the last 72 hours. Hemoglobin A1C: No results for input(s): HGBA1C in the last 72 hours. Fasting Lipid Panel: Recent Labs    05/30/20 0145  CHOL 202*  HDL 46  LDLCALC 113*  TRIG 217*  CHOLHDL 4.4   Thyroid Function Tests: No results for input(s): TSH, T4TOTAL, T3FREE, THYROIDAB in the last 72 hours.  Invalid input(s): FREET3 Anemia Panel: No results for input(s): VITAMINB12, FOLATE, FERRITIN, TIBC, IRON, RETICCTPCT in the last 72 hours.  DG Chest 2 View  Result Date: 05/29/2020 CLINICAL DATA:  Heaviness in chest. EXAM: CHEST - 2 VIEW COMPARISON:  None. FINDINGS: The lungs are clear without focal pneumonia, edema, pneumothorax or pleural effusion. The cardiopericardial silhouette is within normal limits for size. The visualized bony structures of the thorax show no acute abnormality. Telemetry leads overlie the chest. IMPRESSION: No active cardiopulmonary disease. Electronically Signed   By: Kennith CenterEric  Mansell M.D.   On: 05/29/2020 09:19   MR BRAIN WO CONTRAST  Result Date: 05/29/2020 CLINICAL DATA:  TIA.  History of hypertension EXAM: MRI HEAD WITHOUT CONTRAST TECHNIQUE: Multiplanar, multiecho pulse sequences of the brain and surrounding structures were obtained without intravenous contrast. COMPARISON:  None. FINDINGS: Brain: Ventricle size and cerebral volume normal for age. Mild white matter changes with scattered small deep white matter hyperintensities bilaterally.  Mild hyperintensity in the pons bilaterally. Negative for acute infarct, hemorrhage, mass. Vascular: Normal arterial flow void Skull and upper cervical spine: No focal skeletal lesion. Sinuses/Orbits: Moderate mucosal edema left maxillary sinus. Remaining sinuses clear. Negative orbit Other: None IMPRESSION: No acute abnormality Mild chronic microvascular ischemic change. Electronically Signed   By: Marlan Palau M.D.   On: 05/29/2020 14:07   CT ANGIO CHEST AORTA W/CM & OR WO/CM  Result Date: 05/29/2020 CLINICAL  DATA:  80 year old female with chest and back pain. Possible dissection. EXAM: CT ANGIOGRAPHY CHEST WITH CONTRAST TECHNIQUE: Multidetector CT imaging of the chest was performed using the standard protocol prior to and during bolus administration of intravenous contrast. Multiplanar CT image reconstructions and MIPs were obtained to evaluate the vascular anatomy. CONTRAST:  33mL OMNIPAQUE IOHEXOL 350 MG/ML SOLN COMPARISON:  Chest radiographs 0857 hours today. FINDINGS: Cardiovascular: Calcified coronary artery and aortic atherosclerosis. Following contrast the thoracic and visible upper abdominal aorta is otherwise normal. No cardiomegaly.  No pericardial effusion. Mediastinum/Nodes: Negative. Small mediastinal and hilar lymph nodes are within normal limits. Lungs/Pleura: Major airways are patent. Relatively normal lung volumes with mild mosaic attenuation in both lungs, most apparent in the inferior upper and right lower lobes. No pleural effusion. Mild scarring or atelectasis in both middle lobes. Tiny clustered peribronchial nodules in the distal right upper lobe near the major fissure on series 7, image 59. Tiny calcified granuloma in that lobe on series 7, image 38. Upper Abdomen: Negative visible liver, gallbladder, spleen, pancreas, adrenal glands, kidneys, and bowel in the upper abdomen. Musculoskeletal: Bone mineralization is within normal limits. Preserved thoracic vertebral height and alignment. No acute osseous abnormality identified. Review of the MIP images confirms the above findings. IMPRESSION: 1. Negative for thoracic aortic dissection or aneurysm. Positive for Aortic Atherosclerosis (ICD10-I70.0). 2. Tiny postinflammatory appearing right upper lobe lung nodules. 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 Electronically Signed   By: Odessa Fleming M.D.   On: 05/29/2020 10:56   ECHOCARDIOGRAM COMPLETE  Result Date: 05/30/2020    ECHOCARDIOGRAM  REPORT   Patient Name:   Cindy Greene [facility] Date of Exam: 05/29/2020 Medical Rec #:  [id number]            Height:       60.0 in Accession #:    [id number]           Weight:       162.9 lb Date of Birth:  [birth year]             BSA:          1.711 m Patient Age:    80 years             BP:           157/60 mmHg Patient Gender: F                    HR:           79 bpm. Exam Location:  [facility] Procedure: 2D Echo, Cardiac Doppler and Color Doppler Indications:     ACS (acute coronary syndrome)  History:         Patient has no prior history of Echocardiogram examinations.                  Risk Factors:Hypertension.  Sonographer:     Tiburcio Pea, ANN Referring Phys:  [id number] Brien Few NIU Diagnosing Phys: Alwyn Pea MD  Sonographer Comments:  Suboptimal apical window and no subcostal window. IMPRESSIONS  1. Hyperdynamic LVF , No obstruction noted.  2. Left ventricular ejection fraction, by estimation, is 70 to 75%. The left ventricle has hyperdynamic function. The left ventricle has no regional wall motion abnormalities. There is mild concentric left ventricular hypertrophy. Left ventricular diastolic parameters are consistent with Grade III diastolic dysfunction (restrictive).  3. Right ventricular systolic function is normal. The right ventricular size is normal.  4. The mitral valve is normal in structure. Trivial mitral valve regurgitation.  5. The aortic valve is normal in structure. Aortic valve regurgitation is not visualized. FINDINGS  Left Ventricle: Left ventricular ejection fraction, by estimation, is 70 to 75%. The left ventricle has hyperdynamic function. The left ventricle has no regional wall motion abnormalities. The left ventricular internal cavity size was normal in size. There is mild concentric left ventricular hypertrophy. Left ventricular diastolic parameters are consistent with Grade III diastolic dysfunction (restrictive). Right Ventricle: The right ventricular size is normal. No increase in right  ventricular wall thickness. Right ventricular systolic function is normal. Left Atrium: Left atrial size was normal in size. Right Atrium: Right atrial size was normal in size. Pericardium: There is no evidence of pericardial effusion. Mitral Valve: The mitral valve is normal in structure. Trivial mitral valve regurgitation. Tricuspid Valve: The tricuspid valve is normal in structure. Tricuspid valve regurgitation is trivial. Aortic Valve: The aortic valve is normal in structure. Aortic valve regurgitation is not visualized. Aortic valve mean gradient measures 4.0 mmHg. Aortic valve peak gradient measures 7.7 mmHg. Aortic valve area, by VTI measures 2.30 cm. Pulmonic Valve: The pulmonic valve was normal in structure. Pulmonic valve regurgitation is not visualized. Aorta: The ascending aorta was not well visualized. IAS/Shunts: No atrial level shunt detected by color flow Doppler. Additional Comments: Hyperdynamic LVF , No obstruction noted.  LEFT VENTRICLE PLAX 2D LVIDd:         3.89 cm  Diastology LVIDs:         2.78 cm  LV e' medial:   55.80 cm/s LV PW:         1.35 cm  LV E/e' medial: 1.0 LV IVS:        1.07 cm LVOT diam:     1.90 cm LV SV:         58 LV SV Index:   34 LVOT Area:     2.84 cm  LEFT ATRIUM         Index LA diam:    3.50 cm 2.05 cm/m  AORTIC VALVE AV Area (Vmax):    2.18 cm AV Area (Vmean):   1.93 cm AV Area (VTI):     2.30 cm AV Vmax:           139.00 cm/s AV Vmean:          99.400 cm/s AV VTI:            0.252 m AV Peak Grad:      7.7 mmHg AV Mean Grad:      4.0 mmHg LVOT Vmax:         107.00 cm/s LVOT Vmean:        67.800 cm/s LVOT VTI:          0.204 m LVOT/AV VTI ratio: 0.81  AORTA Ao Root diam: 2.50 cm MITRAL VALVE MV Area (PHT): 2.44 cm    SHUNTS MV Decel Time: 311 msec    Systemic VTI:  0.20 m MV E velocity: 55.20 cm/s  Systemic Diam: 1.90 cm Alwyn Pea MD Electronically signed by Alwyn Pea MD Signature Date/Time: 05/30/2020/11:17:53 AM    Final      Echo preserved  left ventricular function  TELEMETRY: Sinus rhythm nonspecific finding  ASSESSMENT AND PLAN:  Principal Problem:   Hypertensive urgency Active Problems:   Arm heaviness   GERD (gastroesophageal reflux disease)   Vertigo Possible angina  Plan Ruled out for myocardial infarction continue conservative therapy Rest of blood pressure control for diastolic dysfunction hypertensive urgency Beta-blockade therapy losartan HCTZ consider adding Cardizem as necessary provided blood pressure support as needed and not bradycardic Aspirin statin Outpatient functional study Okay to discharge home today follow-up as an outpatient  Alwyn Pea, MD 05/30/2020 1:23 PM

## 2020-05-30 NOTE — Plan of Care (Signed)
  Problem: Education: Goal: Knowledge of General Education information will improve Description: Including pain rating scale, medication(s)/side effects and non-pharmacologic comfort measures Outcome: Progressing   Problem: Clinical Measurements: Goal: Ability to maintain clinical measurements within normal limits will improve Outcome: Progressing Goal: Diagnostic test results will improve Outcome: Progressing Goal: Cardiovascular complication will be avoided Outcome: Progressing   

## 2020-05-31 LAB — HEMOGLOBIN A1C
Hgb A1c MFr Bld: 6.4 % — ABNORMAL HIGH (ref 4.8–5.6)
Mean Plasma Glucose: 136.98 mg/dL

## 2021-10-22 ENCOUNTER — Other Ambulatory Visit: Payer: Self-pay | Admitting: Gastroenterology

## 2021-10-22 DIAGNOSIS — R140 Abdominal distension (gaseous): Secondary | ICD-10-CM

## 2021-10-22 DIAGNOSIS — R194 Change in bowel habit: Secondary | ICD-10-CM

## 2021-10-22 DIAGNOSIS — R1084 Generalized abdominal pain: Secondary | ICD-10-CM

## 2021-10-22 DIAGNOSIS — R143 Flatulence: Secondary | ICD-10-CM

## 2021-11-08 ENCOUNTER — Ambulatory Visit
Admission: RE | Admit: 2021-11-08 | Discharge: 2021-11-08 | Disposition: A | Discharge status: Home / Self Care | Payer: Medicare Other | Source: Ambulatory Visit | Attending: Gastroenterology | Admitting: Gastroenterology

## 2021-11-08 DIAGNOSIS — R140 Abdominal distension (gaseous): Secondary | ICD-10-CM | POA: Insufficient documentation

## 2021-11-08 DIAGNOSIS — R1084 Generalized abdominal pain: Secondary | ICD-10-CM | POA: Insufficient documentation

## 2021-11-08 DIAGNOSIS — R194 Change in bowel habit: Secondary | ICD-10-CM | POA: Insufficient documentation

## 2021-11-08 DIAGNOSIS — R143 Flatulence: Secondary | ICD-10-CM | POA: Diagnosis present

## 2021-11-08 LAB — POCT I-STAT CREATININE: Creatinine, Ser: 0.9 mg/dL (ref 0.44–1.00)

## 2021-11-08 MED ORDER — IOHEXOL 300 MG/ML  SOLN
100.0000 mL | Freq: Once | INTRAMUSCULAR | Status: AC | PRN
  Administered 2021-11-08: 100 mL via INTRAVENOUS

## 2023-02-11 IMAGING — MR MR HEAD W/O CM
11 series · 48 of 48 positions shown · non-contrast
Comparison: None.

CLINICAL DATA: TIA.  History of hypertension

EXAM:
MRI HEAD WITHOUT CONTRAST
TECHNIQUE: Multiplanar, multiecho pulse sequences of the brain and surrounding
structures were obtained without intravenous contrast.

[Series 5: ax dwi_tracew · axial · 3.0mm · 0.71mm/px · z∈[-121,+33]mm · 5 of 56 slices shown]
[im 1/56]
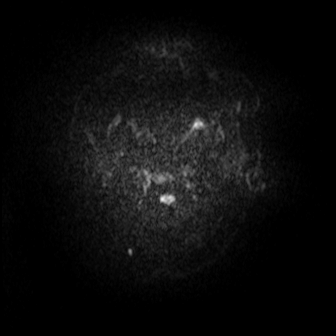
[im 14/56]
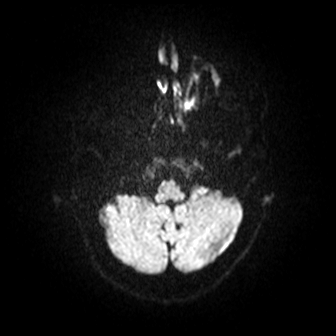
[im 28/56]
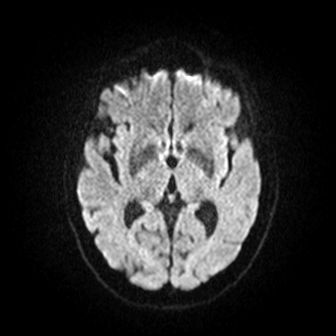
[im 42/56]
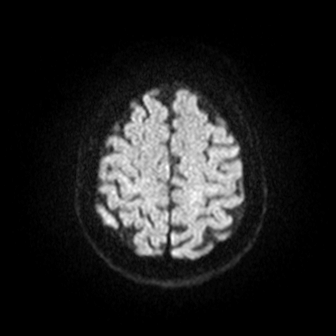
[im 56/56]
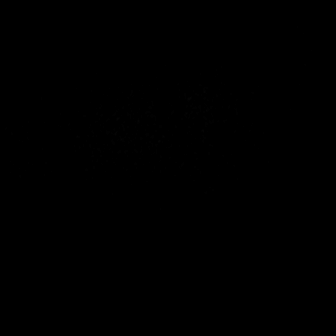

[Series 6: ax dwi_adc · axial · 3.0mm · 0.71mm/px · z∈[-121,+30]mm · 5 of 55 slices shown]
[im 1/55]
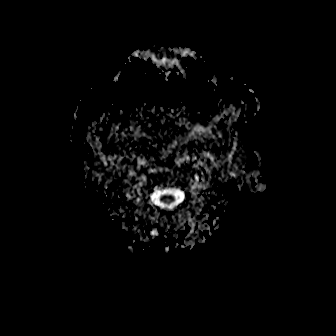
[im 14/55]
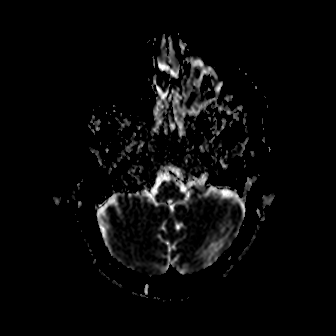
[im 28/55]
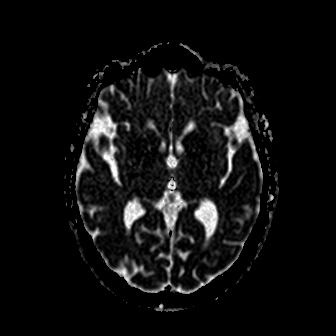
[im 41/55]
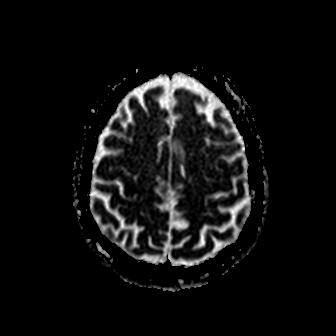
[im 55/55]
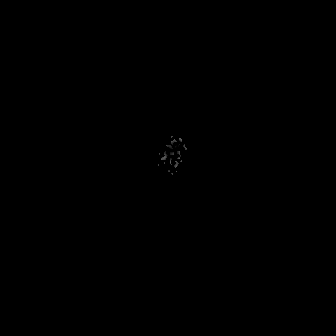

[Series 7: cor dwi_tracew · coronal · 5.0mm · 0.68mm/px · 3 of 40 slices shown]
[im 1/40]
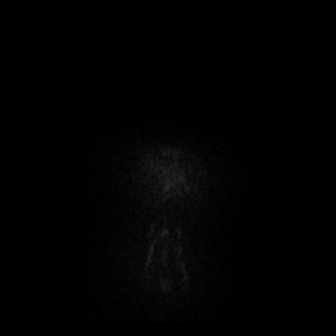
[im 20/40]
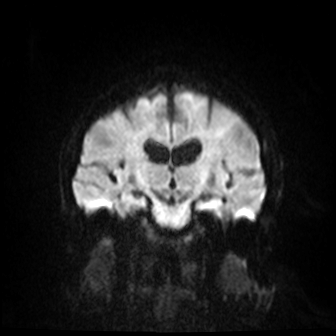
[im 40/40]
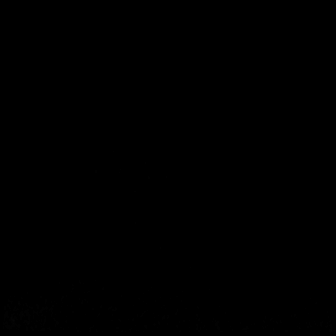

[Series 8: cor dwi_adc · coronal · 5.0mm · 0.68mm/px · 3 of 39 slices shown]
[im 1/39]
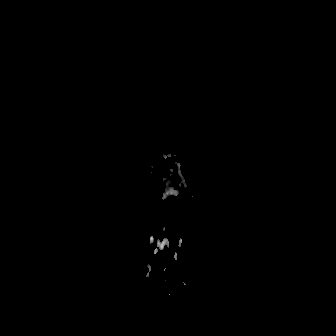
[im 20/39]
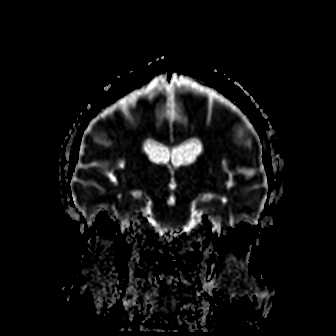
[im 39/39]
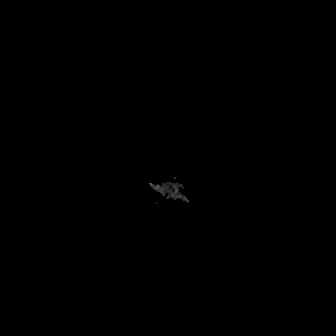

[Series 9: T1 · sagittal · 5.0mm · 0.47mm/px · 2 of 24 slices shown (1 of 2)]
[im 1/24]
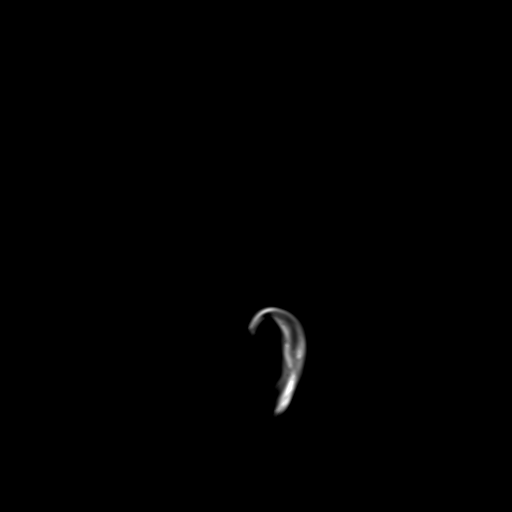
[im 24/24]
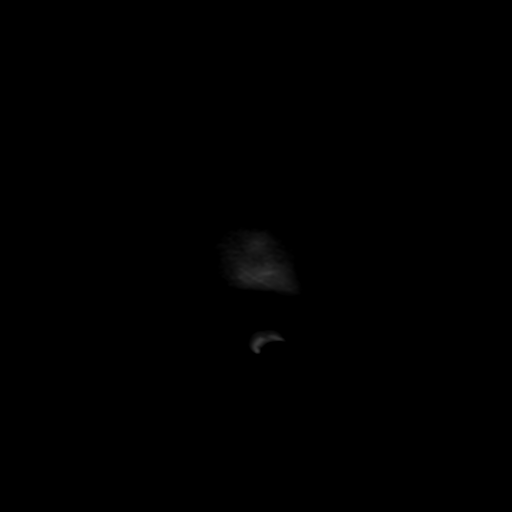

[Series 10: T2 · axial · 5.0mm · 0.86mm/px · z∈[-107,+28]mm · 2 of 25 slices shown (1 of 2)]
[im 1/25]
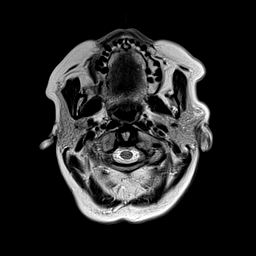
[im 25/25]
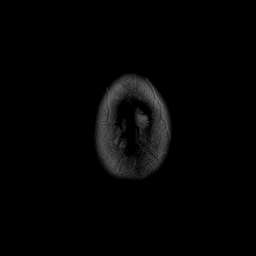

[Series 12: pha_images · axial · 3.0mm · 0.90mm/px · z∈[-112,+31]mm · 4 of 52 slices shown]
[im 1/52]
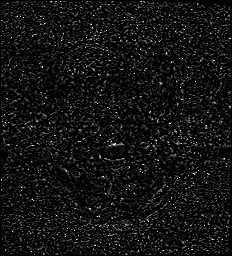
[im 18/52]
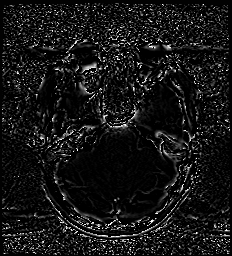
[im 35/52]
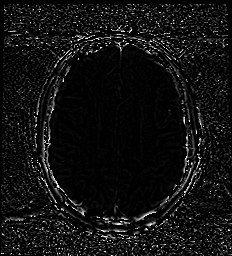
[im 52/52]
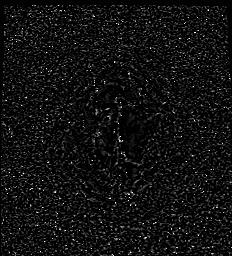

[Series 13: swi_images · axial · 3.0mm · 0.90mm/px · z∈[-112,+31]mm · 4 of 52 slices shown]
[im 1/52]
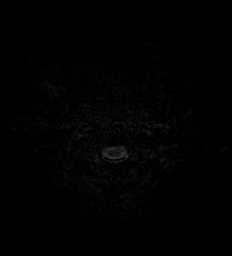
[im 18/52]
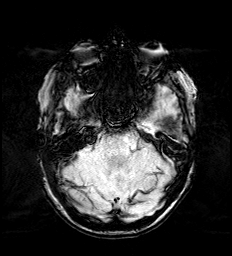
[im 35/52]
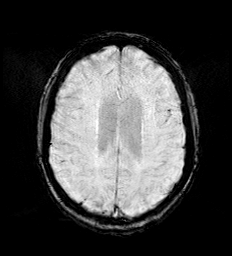
[im 52/52]
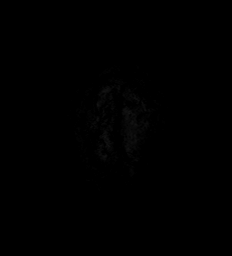

[Series 15: FLAIR · axial · 3.0mm · 0.69mm/px · z∈[-115,+36]mm · 4 of 55 slices shown]
[im 1/55]
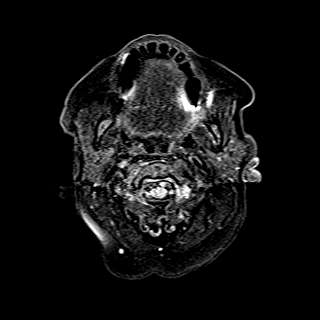
[im 19/55]
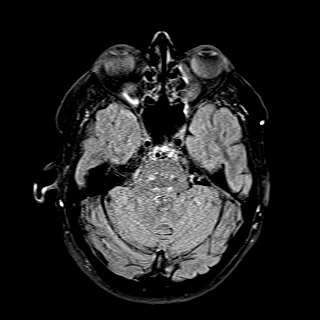
[im 37/55]
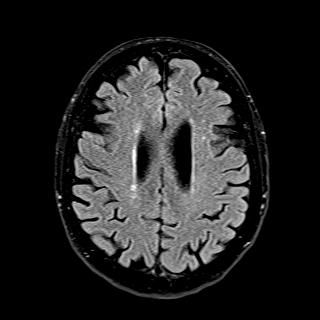
[im 55/55]
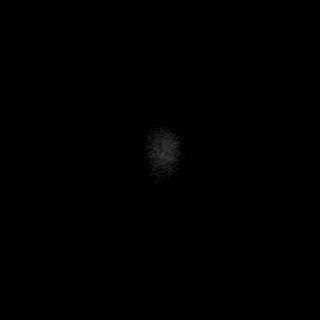

[Series 16: T1 · axial · 1.0mm · 0.98mm/px · z∈[-119,+45]mm · 14 of 175 slices shown (2 of 2)]
[im 1/175]
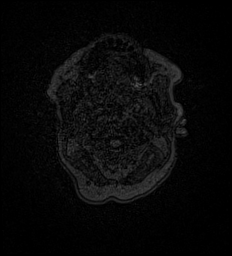
[im 14/175]
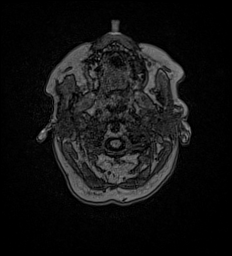
[im 27/175]
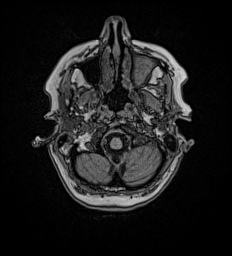
[im 41/175]
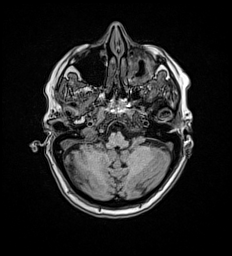
[im 54/175]
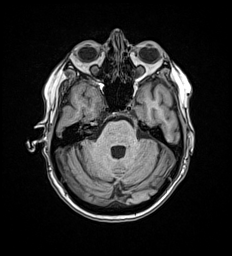
[im 67/175]
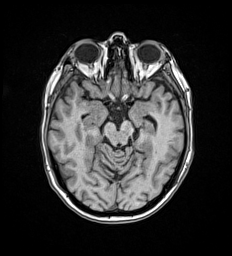
[im 81/175]
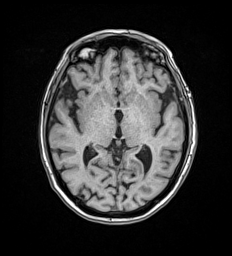
[im 94/175]
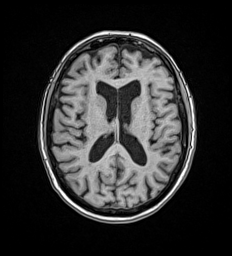
[im 108/175]
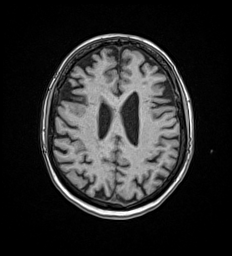
[im 121/175]
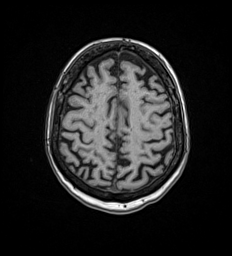
[im 134/175]
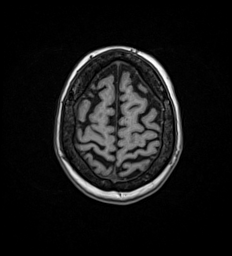
[im 148/175]
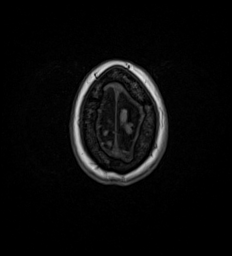
[im 161/175]
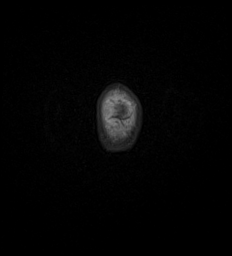
[im 175/175]
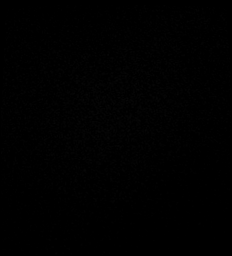

[Series 17: T2 · coronal · 5.0mm · 0.86mm/px · 2 of 30 slices shown (2 of 2)]
[im 1/30]
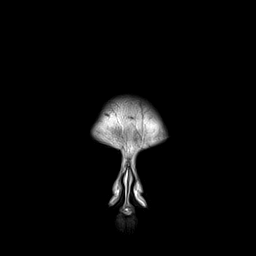
[im 30/30]
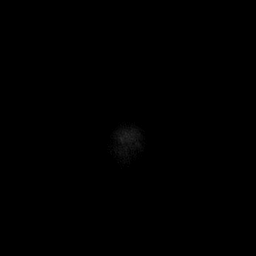

[48 of 48 positions shown; findings below may reference images not displayed]

FINDINGS: Brain: Ventricle size and cerebral volume normal for age. Mild white
matter changes with scattered small deep white matter
hyperintensities bilaterally. Mild hyperintensity in the pons
bilaterally.

Negative for acute infarct, hemorrhage, mass.

Vascular: Normal arterial flow void

Skull and upper cervical spine: No focal skeletal lesion.

Sinuses/Orbits: Moderate mucosal edema left maxillary sinus.
Remaining sinuses clear. Negative orbit

Other: None
IMPRESSION: No acute abnormality

Mild chronic microvascular ischemic change.

## 2023-08-03 ENCOUNTER — Ambulatory Visit: Admitting: Anesthesiology

## 2023-08-03 ENCOUNTER — Other Ambulatory Visit: Payer: Self-pay

## 2023-08-03 ENCOUNTER — Encounter
Admission: RE | Disposition: A | Discharge status: Home / Self Care | Payer: Self-pay | Source: Home / Self Care | Attending: Gastroenterology

## 2023-08-03 ENCOUNTER — Ambulatory Visit
Admission: RE | Admit: 2023-08-03 | Discharge: 2023-08-03 | Disposition: A | Discharge status: Home / Self Care | Attending: Gastroenterology | Admitting: Gastroenterology

## 2023-08-03 ENCOUNTER — Encounter: Payer: Self-pay | Admitting: Gastroenterology

## 2023-08-03 DIAGNOSIS — K529 Noninfective gastroenteritis and colitis, unspecified: Secondary | ICD-10-CM | POA: Diagnosis present

## 2023-08-03 DIAGNOSIS — D120 Benign neoplasm of cecum: Secondary | ICD-10-CM | POA: Insufficient documentation

## 2023-08-03 DIAGNOSIS — I5032 Chronic diastolic (congestive) heart failure: Secondary | ICD-10-CM | POA: Insufficient documentation

## 2023-08-03 DIAGNOSIS — I110 Hypertensive heart disease with heart failure: Secondary | ICD-10-CM | POA: Insufficient documentation

## 2023-08-03 HISTORY — PX: POLYPECTOMY: SHX149

## 2023-08-03 HISTORY — PX: COLONOSCOPY: SHX5424

## 2023-08-03 SURGERY — COLONOSCOPY
Anesthesia: General

## 2023-08-03 MED ORDER — SODIUM CHLORIDE 0.9 % IV SOLN
INTRAVENOUS | Status: DC
  Administered 2023-08-03: 500 mL via INTRAVENOUS

## 2023-08-03 MED ORDER — PROPOFOL 500 MG/50ML IV EMUL
INTRAVENOUS | Status: DC | PRN
  Administered 2023-08-03: 140 ug/kg/min via INTRAVENOUS

## 2023-08-03 MED ORDER — PROPOFOL 10 MG/ML IV BOLUS
INTRAVENOUS | Status: DC | PRN
  Administered 2023-08-03: 70 mg via INTRAVENOUS

## 2023-08-03 NOTE — Interval H&P Note (Signed)
 History and Physical Interval Note: Preprocedure H&P from 08/03/23  was reviewed and there was no interval change after seeing and examining the patient.  Written consent was obtained from the patient after discussion of risks, benefits, and alternatives. Patient has consented to proceed with Colonoscopy with possible intervention   08/03/2023 12:27 PM  Consuelo LITTIE Fleeta Rowe  has presented today for surgery, with the diagnosis of Chronic diarrhea [K52.9]  Elevated fecal calprotectin [R19.5] Change in bowel habits [R19.4].  The various methods of treatment have been discussed with the patient and family. After consideration of risks, benefits and other options for treatment, the patient has consented to  Procedure(s): COLONOSCOPY (N/A) as a surgical intervention.  The patient's history has been reviewed, patient examined, no change in status, stable for surgery.  I have reviewed the patient's chart and labs.  Questions were answered to the patient's satisfaction.     Elspeth Ozell Jungling

## 2023-08-03 NOTE — Anesthesia Preprocedure Evaluation (Addendum)
 Anesthesia Evaluation  Patient identified by MRN, date of birth, ID band Patient awake    Reviewed: Allergy & Precautions, NPO status , Patient's Chart, lab work & pertinent test results  History of Anesthesia Complications Negative for: history of anesthetic complications  Airway Mallampati: IV   Neck ROM: Full    Dental no notable dental hx.    Pulmonary neg pulmonary ROS   Pulmonary exam normal breath sounds clear to auscultation       Cardiovascular hypertension, +CHF (EF 45-50%)  Normal cardiovascular exam Rhythm:Regular Rate:Normal  Myocardial perfusion 08/20/20:   Borderline myocardial perfusion scan there is a mild to moderate lateral wall persistent defect but no clear evidence of ischemia  Normal ventricular size mildly reduced overall left ventricular function 45 to 50%.  Recommend medical therapy for now unless symptoms persist or worsen    Neuro/Psych Vertigo     GI/Hepatic ,GERD  ,,  Endo/Other  negative endocrine ROS    Renal/GU negative Renal ROS     Musculoskeletal  (+) Arthritis ,    Abdominal   Peds  Hematology negative hematology ROS (+)   Anesthesia Other Findings Cardiology note 10/12/22:  83 y.o. female with  1. History of chest pain  2. SOB (shortness of breath)  3. Primary hypertension  4. Pure hypercholesterolemia  5. Mixed hyperlipidemia  6. Class 1 obesity due to excess calories without serious comorbidity with body mass index (BMI) of 32.0 to 32.9 in adult  7. Edema of both legs  8. Angina at rest (CMS-HCC)  9. Chronic diastolic CHF (congestive heart failure) (CMS/HHS-HCC)  10. Gastroesophageal reflux disease without esophagitis  11. Vertigo   Plan  Atypical chest pain appears to be noncardiac with negative cardiac workup recommend conservative management Shortness of breath minimal related to moderate to severe activity otherwise unremarkable Minimal lower extremity edema  recommend support stockings elevation as necessary Hyperlipidemia continue Lipitor therapy for lipid management follow-up with primary physician Hypertension reasonably controlled currently maintained on losartan  metoprolol  HCTZ GERD continue omeprazole as necessary for reflux type symptoms Borderline obesity recommend modest weight loss exercise portion control Vertigo mild consider meclizine  if symptoms recur Have the patient follow-up as needed  Return if symptoms worsen or fail to improve.    Reproductive/Obstetrics                              Anesthesia Physical Anesthesia Plan  ASA: 2  Anesthesia Plan: General   Post-op Pain Management:    Induction: Intravenous  PONV Risk Score and Plan: 3 and Propofol  infusion, TIVA and Treatment may vary due to age or medical condition  Airway Management Planned: Natural Airway  Additional Equipment:   Intra-op Plan:   Post-operative Plan:   Informed Consent: I have reviewed the patients History and Physical, chart, labs and discussed the procedure including the risks, benefits and alternatives for the proposed anesthesia with the patient or authorized representative who has indicated his/her understanding and acceptance.       Plan Discussed with: CRNA  Anesthesia Plan Comments: (LMA/GETA backup discussed.  Patient consented for risks of anesthesia including but not limited to:  - adverse reactions to medications - damage to eyes, teeth, lips or other oral mucosa - nerve damage due to positioning  - sore throat or hoarseness - damage to heart, brain, nerves, lungs, other parts of body or loss of life  Informed patient about role of CRNA in peri- and intra-operative  care.  Patient voiced understanding.)         Anesthesia Quick Evaluation

## 2023-08-03 NOTE — H&P (Signed)
 Pre-Procedure H&P   Patient ID: Cindy Greene is a 83 y.o. female.  Gastroenterology Provider: Elspeth Ozell Jungling, DO  Referring Provider: Jonette Primmer, PA PCP: Rudolpho Norleen BIRCH, MD  Date: 08/03/2023  HPI Ms. Cindy Greene is a 83 y.o. female who presents today for Colonoscopy for Chronic diarrhea, change in bowel habits .  Patient with 3-4 loose bowel movements and urgency over the last 6+ weeks with previously existing diarrhea as well.  Infectious workup, celiac all negative.  Normal pancreatic elastase.  Heavy NSAID use with ibuprofen 600 mg multiple times a day.  Tobacco use.  Elevated fecal calprotectin at 403 CRP 10 ESR 23 creatinine 0.8 hemoglobin 14.3 MCV 92 platelets 352,000  Last colonoscopy in 2017   Past Medical History:  Diagnosis Date   Arthritis    osteoarthritis   GERD (gastroesophageal reflux disease)    Hypertension    Positional vertigo     Past Surgical History:  Procedure Laterality Date   ANKLE SURGERY     APPENDECTOMY     COLONOSCOPY WITH PROPOFOL  N/A 09/24/2015   Procedure: COLONOSCOPY WITH PROPOFOL ;  Surgeon: Lamar ONEIDA Holmes, MD;  Location: [facility];  Service: Endoscopy;  Laterality: N/A;   excision ganglion cyst wrist     KNEE ARTHROSCOPY WITH MENISCAL REPAIR Right 04/20/2016   Procedure: KNEE ARTHROSCOPY WITH MENISCAL REPAIR;  Surgeon: Norleen JINNY Maltos, MD;  Location: [facility];  Service: Orthopedics;  Laterality: Right;   left foot reconstrucion     right foot reconstruction     TONSILLECTOMY      Family History Brother- crc No other h/o GI disease or malignancy  Review of Systems  Constitutional:  Negative for activity change, appetite change, chills, diaphoresis, fatigue, fever and unexpected weight change.  HENT:  Negative for trouble swallowing and voice change.   Respiratory:  Negative for shortness of breath and wheezing.   Cardiovascular:  Negative for chest pain, palpitations and leg swelling.   Gastrointestinal:  Positive for diarrhea. Negative for abdominal distention, abdominal pain, anal bleeding, blood in stool, constipation, nausea, rectal pain and vomiting.  Musculoskeletal:  Negative for arthralgias and myalgias.  Skin:  Negative for color change and pallor.  Neurological:  Negative for dizziness, syncope and weakness.  Psychiatric/Behavioral:  Negative for confusion.   All other systems reviewed and are negative.    Medications No current facility-administered medications on file prior to encounter.   Current Outpatient Medications on File Prior to Encounter  Medication Sig Dispense Refill   atorvastatin  (LIPITOR) 20 MG tablet Take 2 tablets (40 mg total) by mouth daily. 60 tablet 1   Calcium  Carb-Cholecalciferol  (CALCIUM  CARBONATE-VITAMIN D3 PO) Take by mouth.     cetirizine (ZYRTEC) 10 MG tablet Take 10 mg by mouth daily.     Cholecalciferol  (VITAMIN D3) 1000 units CAPS Take by mouth.     Ginkgo Biloba 40 MG TABS Take by mouth.     Glucos-MSM-C-Mn-Ginger-Willow (MSM GLUCOSAMINE COMPLEX PO) Take by mouth.     hydrochlorothiazide  (HYDRODIURIL ) 25 MG tablet Take 25 mg by mouth daily.     ibuprofen (ADVIL) 200 MG tablet Take 600 mg by mouth 3 (three) times daily.     losartan  (COZAAR ) 50 MG tablet Take 1 tablet (50 mg total) by mouth daily. 30 tablet 1   Meclizine  HCl (BONINE PO) Take 1 tablet by mouth as needed.     metoprolol  tartrate (LOPRESSOR ) 50 MG tablet Take 1 tablet (50 mg total) by mouth  2 (two) times daily. 60 tablet 0   Omega-3 Fatty Acids (FISH OIL CONCENTRATE PO) Take by mouth.     omeprazole (PRILOSEC) 20 MG capsule Take 20 mg by mouth daily.     progesterone  (PROMETRIUM ) 200 MG capsule Take 200 mg by mouth daily.     scopolamine (TRANSDERM-SCOP) 1 MG/3DAYS Place 1 patch onto the skin every 3 (three) days.     traMADol  (ULTRAM ) 50 MG tablet Take 50 mg by mouth every 8 (eight) hours as needed for pain.     Turmeric (QC TUMERIC COMPLEX) 500 MG CAPS Take by  mouth.     acetaminophen  (TYLENOL ) 650 MG CR tablet Take 650 mg by mouth every 8 (eight) hours as needed for pain. (Patient not taking: Reported on 05/29/2020)     aspirin  EC 81 MG EC tablet Take 1 tablet (81 mg total) by mouth daily. Swallow whole. (Patient not taking: Reported on 08/03/2023) 30 tablet 11   estradiol (CLIMARA - DOSED IN MG/24 HR) 0.05 mg/24hr patch Place 0.05 mg onto the skin once a week.      Pertinent medications related to GI and procedure were reviewed by me with the patient prior to the procedure   Current Facility-Administered Medications:    0.9 %  sodium chloride  infusion, , Intravenous, Continuous, Onita Elspeth Sharper, DO  sodium chloride          Allergies  Allergen Reactions   Ciprofloxacin     Other reaction(s): Unknown   Tramadol  Other (See Comments)    Heart fluttering; felt like she might faint; is able to take it at a lower dose, and it is okay   Allergies were reviewed by me prior to the procedure  Objective   Body mass index is 31.4 kg/m. Vitals:   08/03/23 1218  BP: 137/76  Pulse: 70  Resp: 14  Temp: (!) 97.3 F (36.3 C)  TempSrc: Tympanic  SpO2: 96%  Weight: 72.9 kg  Height: 5' (1.524 m)     Physical Exam Vitals and nursing note reviewed.  Constitutional:      General: She is not in acute distress.    Appearance: Normal appearance. She is not ill-appearing, toxic-appearing or diaphoretic.  HENT:     Head: Normocephalic and atraumatic.     Nose: Nose normal.     Mouth/Throat:     Mouth: Mucous membranes are moist.     Pharynx: Oropharynx is clear.  Eyes:     General: No scleral icterus.    Extraocular Movements: Extraocular movements intact.  Cardiovascular:     Rate and Rhythm: Normal rate and regular rhythm.     Heart sounds: Normal heart sounds. No murmur heard.    No friction rub. No gallop.  Pulmonary:     Effort: Pulmonary effort is normal. No respiratory distress.     Breath sounds: Normal breath sounds. No  wheezing, rhonchi or rales.  Abdominal:     General: Bowel sounds are normal. There is no distension.     Palpations: Abdomen is soft.     Tenderness: There is no abdominal tenderness. There is no guarding or rebound.  Musculoskeletal:     Cervical back: Neck supple.     Right lower leg: No edema.     Left lower leg: No edema.  Skin:    General: Skin is warm and dry.     Coloration: Skin is not jaundiced or pale.  Neurological:     General: No focal deficit present.  Mental Status: She is alert and oriented to person, place, and time. Mental status is at baseline.  Psychiatric:        Mood and Affect: Mood normal.        Behavior: Behavior normal.        Thought Content: Thought content normal.        Judgment: Judgment normal.      Assessment:  Ms. Cindy Greene is a 83 y.o. female  who presents today for Colonoscopy for Chronic diarrhea, change in bowel habits .  Plan:  Colonoscopy with possible intervention today  Colonoscopy with possible biopsy, control of bleeding, polypectomy, and interventions as necessary has been discussed with the patient/patient representative. Informed consent was obtained from the patient/patient representative after explaining the indication, nature, and risks of the procedure including but not limited to death, bleeding, perforation, missed neoplasm/lesions, cardiorespiratory compromise, and reaction to medications. Opportunity for questions was given and appropriate answers were provided. Patient/patient representative has verbalized understanding is amenable to undergoing the procedure.   Elspeth Ozell Jungling, DO  [facility]  Portions of the record may have been created with voice recognition software. Occasional wrong-word or 'sound-a-like' substitutions may have occurred due to the inherent limitations of voice recognition software.  Read the chart carefully and recognize, using context, where substitutions may  have occurred.

## 2023-08-03 NOTE — Op Note (Signed)
 [facility] Patient Name: Cindy Greene [facility] Procedure Date: 08/03/2023 12:40 PM MRN: [id number] Account #: [id number] Date of Birth: 1940/01/28 Admit Type: Outpatient Age: 83 Room: [facility] ROOM 1 Gender: Female Note Status: Finalized Instrument Name: Peds Colonoscope [id number] Procedure:             Colonoscopy Indications:           Chronic diarrhea, Change in bowel habits, elevated                         fecal calprotectin Providers:             Elspeth Ozell Jungling DO, DO Medicines:             Monitored Anesthesia Care Complications:         No immediate complications. Estimated blood loss:                         Minimal. Procedure:             Pre-Anesthesia Assessment:                        - Prior to the procedure, a History and Physical was                         performed, and patient medications and allergies were                         reviewed. The patient is competent. The risks and                         benefits of the procedure and the sedation options and                         risks were discussed with the patient. All questions                         were answered and informed consent was obtained.                         Patient identification and proposed procedure were                         verified by the physician, the nurse, the anesthetist                         and the technician in the endoscopy suite. Mental                         Status Examination: alert and oriented. Airway                         Examination: normal oropharyngeal airway and neck                         mobility. Respiratory Examination: clear to                         auscultation. CV Examination: RRR, no murmurs, no S3  or S4. Prophylactic Antibiotics: The patient does not                         require prophylactic antibiotics. Prior                         Anticoagulants: The patient has taken no  anticoagulant                         or antiplatelet agents. ASA Grade Assessment: III - A                         patient with severe systemic disease. After reviewing                         the risks and benefits, the patient was deemed in                         satisfactory condition to undergo the procedure. The                         anesthesia plan was to use monitored anesthesia care                         (MAC). Immediately prior to administration of                         medications, the patient was re-assessed for adequacy                         to receive sedatives. The heart rate, respiratory                         rate, oxygen saturations, blood pressure, adequacy of                         pulmonary ventilation, and response to care were                         monitored throughout the procedure. The physical                         status of the patient was re-assessed after the                         procedure.                        After obtaining informed consent, the colonoscope was                         passed under direct vision. Throughout the procedure,                         the patient's blood pressure, pulse, and oxygen                         saturations were monitored continuously. The  Colonoscope was introduced through the anus and                         advanced to the the terminal ileum, with                         identification of the appendiceal orifice and IC                         valve. The colonoscopy was performed without                         difficulty. The patient tolerated the procedure well.                         The quality of the bowel preparation was good. The                         patient tolerated the procedure well. The terminal                         ileum, ileocecal valve, appendiceal orifice, and                         rectum were photographed. Findings:      The perianal and digital  rectal examinations were normal. Pertinent       negatives include normal sphincter tone.      The terminal ileum appeared normal. Estimated blood loss: none.      Retroflexion in the right colon was performed.      A 1 mm polyp was found in the cecum. The polyp was sessile. The polyp       was removed with a cold biopsy forceps. Resection and retrieval were       complete. Estimated blood loss was minimal.      A moderate amount of stool was found in the entire colon, interfering       with visualization. Lavage of the area was performed using a large       amount, resulting in clearance with good visualization. Estimated blood       loss: none.      Normal mucosa was found in the entire colon. Biopsies for histology were       taken with a cold forceps from the right colon, left colon and       transverse colon for evaluation of microscopic colitis. Estimated blood       loss was minimal.      The exam was otherwise without abnormality on direct and retroflexion       views. Impression:            - The examined portion of the ileum was normal.                        - One 1 mm polyp in the cecum, removed with a cold                         biopsy forceps. Resected and retrieved.                        -  Stool in the entire examined colon.                        - Normal mucosa in the entire examined colon. Biopsied.                        - The examination was otherwise normal on direct and                         retroflexion views. Recommendation:        - Patient has a contact number available for                         emergencies. The signs and symptoms of potential                         delayed complications were discussed with the patient.                         Return to normal activities tomorrow. Written                         discharge instructions were provided to the patient.                        - Discharge patient to home.                        - Resume  previous diet.                        - Continue present medications.                        - Await pathology results.                        - No ibuprofen, naproxen, or other non-steroidal                         anti-inflammatory drugs.                        - Return to GI office as previously scheduled.                        - Continue imodium or lomotil until biopsy results for                         symptom relief                        - The findings and recommendations were discussed with                         the patient.                        - The findings and recommendations were discussed with  the patient's family. Procedure Code(s):     --- Professional ---                        [phone], Colonoscopy, flexible; with biopsy, single or                         multiple Diagnosis Code(s):     --- Professional ---                        D12.0, Benign neoplasm of cecum                        K52.9, Noninfective gastroenteritis and colitis,                         unspecified                        R19.4, Change in bowel habit CPT copyright 2022 [employer]. All rights reserved. The codes documented in this report are preliminary and upon coder review may  be revised to meet current compliance requirements. Attending Participation:      I personally performed the entire procedure. Elspeth Jungling, DO Elspeth Ozell Jungling DO, DO 08/03/2023 1:15:03 PM This report has been signed electronically. Number of Addenda: 0 Note Initiated On: 08/03/2023 12:40 PM Scope Withdrawal Time: 0 hours 12 minutes 21 seconds  Total Procedure Duration: 0 hours 15 minutes 45 seconds  Estimated Blood Loss:  Estimated blood loss was minimal.      [facility]

## 2023-08-03 NOTE — Transfer of Care (Signed)
 Immediate Anesthesia Transfer of Care Note  Patient: Cindy Greene [facility]  Procedure(s) Performed: COLONOSCOPY POLYPECTOMY, INTESTINE  Patient Location: PACU  Anesthesia Type:General  Level of Consciousness: awake, alert , and oriented  Airway & Oxygen Therapy: Patient Spontanous Breathing  Post-op Assessment: Report given to RN and Post -op Vital signs reviewed and stable  Post vital signs: Reviewed and stable  Last Vitals:  Vitals Value Taken Time  BP 119/39 08/03/23 13:14  Temp    Pulse 56 08/03/23 13:15  Resp 24 08/03/23 13:15  SpO2 97 % 08/03/23 13:15  Vitals shown include unfiled device data.  Last Pain:  Vitals:   08/03/23 1218  TempSrc: Tympanic  PainSc: 0-No pain      Patients Stated Pain Goal: 8 (08/03/23 1218)  Complications: No notable events documented.

## 2023-08-04 ENCOUNTER — Encounter: Payer: Self-pay | Admitting: Gastroenterology

## 2023-08-04 LAB — SURGICAL PATHOLOGY

## 2023-08-04 NOTE — Anesthesia Postprocedure Evaluation (Signed)
 Anesthesia Post Note  Patient: Cindy Greene [facility]  Procedure(s) Performed: COLONOSCOPY POLYPECTOMY, INTESTINE  Patient location during evaluation: PACU Anesthesia Type: General Level of consciousness: awake and alert, oriented and patient cooperative Pain management: pain level controlled Vital Signs Assessment: post-procedure vital signs reviewed and stable Respiratory status: spontaneous breathing, nonlabored ventilation and respiratory function stable Cardiovascular status: blood pressure returned to baseline and stable Postop Assessment: adequate PO intake Anesthetic complications: no   No notable events documented.   Last Vitals:  Vitals:   08/03/23 1337 08/03/23 1340  BP: (!) 146/83   Pulse: 60 (!) 59  Resp: 19 17  Temp:    SpO2: 99% 97%    Last Pain:  Vitals:   08/03/23 1310  TempSrc: Temporal  PainSc:                  Alfonso Ruths
